# Patient Record
Sex: Female | Born: 1980 | Race: Black or African American | Hispanic: No | Marital: Married | Smoking: Former smoker
Health system: Southern US, Community
[De-identification: ages and names within clinical notes are randomized; demographics above are authoritative.]

## PROBLEM LIST (undated history)

## (undated) DIAGNOSIS — J45909 Unspecified asthma, uncomplicated: Secondary | ICD-10-CM

## (undated) DIAGNOSIS — I10 Essential (primary) hypertension: Secondary | ICD-10-CM

## (undated) DIAGNOSIS — N83209 Unspecified ovarian cyst, unspecified side: Secondary | ICD-10-CM

## (undated) DIAGNOSIS — O24419 Gestational diabetes mellitus in pregnancy, unspecified control: Secondary | ICD-10-CM

## (undated) DIAGNOSIS — D649 Anemia, unspecified: Secondary | ICD-10-CM

## (undated) HISTORY — DX: Anemia, unspecified: D64.9

## (undated) HISTORY — DX: Gestational diabetes mellitus in pregnancy, unspecified control: O24.419

---

## 2006-01-02 ENCOUNTER — Emergency Department: Payer: Self-pay

## 2006-07-16 ENCOUNTER — Emergency Department: Payer: Self-pay | Admitting: Emergency Medicine

## 2006-07-16 ENCOUNTER — Other Ambulatory Visit: Payer: Self-pay

## 2012-01-11 ENCOUNTER — Emergency Department: Payer: Self-pay | Admitting: Emergency Medicine

## 2012-01-11 LAB — BASIC METABOLIC PANEL
Anion Gap: 7 (ref 7–16)
Co2: 29 mmol/L (ref 21–32)
Creatinine: 0.7 mg/dL (ref 0.60–1.30)
EGFR (African American): >60
EGFR (Non-African Amer.): >60
Osmolality: 281 (ref 275–301)
Potassium: 4 mmol/L (ref 3.5–5.1)

## 2012-01-11 LAB — CBC
HGB: 14 g/dL (ref 12.0–16.0)
MCV: 90 fL (ref 80–100)
RBC: 4.55 10*6/uL (ref 3.80–5.20)
WBC: 10.2 10*3/uL (ref 3.6–11.0)

## 2012-01-11 LAB — CK TOTAL AND CKMB (NOT AT ARMC): CK-MB: 0.6 ng/mL (ref 0.5–3.6)

## 2012-01-12 LAB — CK TOTAL AND CKMB (NOT AT ARMC)
CK, Total: 83 U/L (ref 21–215)
CK-MB: 0.8 ng/mL (ref 0.5–3.6)

## 2012-01-12 LAB — TROPONIN I: Troponin-I: <0.02 ng/mL

## 2015-01-09 ENCOUNTER — Encounter: Payer: Self-pay | Admitting: Emergency Medicine

## 2015-01-09 ENCOUNTER — Emergency Department: Payer: BLUE CROSS/BLUE SHIELD

## 2015-01-09 ENCOUNTER — Emergency Department
Admission: EM | Admit: 2015-01-09 | Discharge: 2015-01-09 | Disposition: A | Discharge status: Home / Self Care | Payer: BLUE CROSS/BLUE SHIELD | Attending: Emergency Medicine | Admitting: Emergency Medicine

## 2015-01-09 DIAGNOSIS — R1030 Lower abdominal pain, unspecified: Secondary | ICD-10-CM | POA: Insufficient documentation

## 2015-01-09 DIAGNOSIS — Z3202 Encounter for pregnancy test, result negative: Secondary | ICD-10-CM | POA: Insufficient documentation

## 2015-01-09 DIAGNOSIS — Z720 Tobacco use: Secondary | ICD-10-CM | POA: Diagnosis not present

## 2015-01-09 DIAGNOSIS — Z8742 Personal history of other diseases of the female genital tract: Secondary | ICD-10-CM

## 2015-01-09 DIAGNOSIS — R1031 Right lower quadrant pain: Secondary | ICD-10-CM

## 2015-01-09 DIAGNOSIS — N83519 Torsion of ovary and ovarian pedicle, unspecified side: Secondary | ICD-10-CM

## 2015-01-09 HISTORY — DX: Unspecified ovarian cyst, unspecified side: N83.209

## 2015-01-09 LAB — URINALYSIS COMPLETE WITH MICROSCOPIC (ARMC ONLY)
Bilirubin Urine: NEGATIVE
GLUCOSE, UA: NEGATIVE mg/dL
Ketones, ur: NEGATIVE mg/dL
Leukocytes, UA: NEGATIVE
NITRITE: NEGATIVE
Protein, ur: NEGATIVE mg/dL
SPECIFIC GRAVITY, URINE: 1.019 (ref 1.005–1.030)
pH: 6 (ref 5.0–8.0)

## 2015-01-09 LAB — CBC WITH DIFFERENTIAL/PLATELET
Basophils Absolute: 0 10*3/uL (ref 0–0.1)
Basophils Relative: 0 %
EOS ABS: 0 10*3/uL (ref 0–0.7)
EOS PCT: 0 %
HCT: 41.2 % (ref 35.0–47.0)
Hemoglobin: 13.7 g/dL (ref 12.0–16.0)
LYMPHS ABS: 2.6 10*3/uL (ref 1.0–3.6)
Lymphocytes Relative: 24 %
MCH: 29.3 pg (ref 26.0–34.0)
MCHC: 33.3 g/dL (ref 32.0–36.0)
MCV: 88.1 fL (ref 80.0–100.0)
MONOS PCT: 5 %
Monocytes Absolute: 0.6 10*3/uL (ref 0.2–0.9)
Neutro Abs: 7.6 10*3/uL — ABNORMAL HIGH (ref 1.4–6.5)
Neutrophils Relative %: 71 %
PLATELETS: 233 10*3/uL (ref 150–440)
RBC: 4.68 MIL/uL (ref 3.80–5.20)
RDW: 13.7 % (ref 11.5–14.5)
WBC: 10.8 10*3/uL (ref 3.6–11.0)

## 2015-01-09 LAB — COMPREHENSIVE METABOLIC PANEL
ALT: 14 U/L (ref 14–54)
ANION GAP: 4 — AB (ref 5–15)
AST: 20 U/L (ref 15–41)
Albumin: 4.2 g/dL (ref 3.5–5.0)
Alkaline Phosphatase: 56 U/L (ref 38–126)
BUN: 8 mg/dL (ref 6–20)
CALCIUM: 9 mg/dL (ref 8.9–10.3)
CHLORIDE: 106 mmol/L (ref 101–111)
CO2: 27 mmol/L (ref 22–32)
Creatinine, Ser: 0.86 mg/dL (ref 0.44–1.00)
GFR calc non Af Amer: >60 mL/min (ref >60)
Glucose, Bld: 103 mg/dL — ABNORMAL HIGH (ref 65–99)
Potassium: 3.9 mmol/L (ref 3.5–5.1)
SODIUM: 137 mmol/L (ref 135–145)
Total Bilirubin: 0.4 mg/dL (ref 0.3–1.2)
Total Protein: 7.3 g/dL (ref 6.5–8.1)

## 2015-01-09 LAB — POCT PREGNANCY, URINE: PREG TEST UR: NEGATIVE

## 2015-01-09 MED ORDER — IBUPROFEN 600 MG PO TABS
600.0000 mg | ORAL_TABLET | Freq: Once | ORAL | Status: AC
  Administered 2015-01-09: 600 mg via ORAL
  Filled 2015-01-09: qty 1

## 2015-01-09 MED ORDER — TRAMADOL HCL 50 MG PO TABS: 50.0000 mg | ORAL_TABLET | Freq: Four times a day (QID) | ORAL | Status: DC | PRN

## 2015-01-09 NOTE — ED Notes (Signed)
Pt refuses IV for IV pain meds. MD notified and po meds given.

## 2015-01-09 NOTE — ED Notes (Signed)
Patient transported to Ultrasound 

## 2015-01-09 NOTE — ED Notes (Signed)
Pt to ED with c/o lower abd. Cramping and vaginal pain, states today after having a BM she started having pain with urination and difficulty urinating, hx of ovarian cysts and states this feels the same

## 2015-01-09 NOTE — ED Provider Notes (Signed)
Time Seen: Approximately 446  I have reviewed the triage notes  Chief Complaint: Abdominal Pain   History of Present Illness: Elizabeth Bryan is a 34 y.o. female who presents with lower middle quadrant and right ovary left lower abdominal pain. She states she noticed the pain approximately 2:30 this afternoon and states she's had some similar pain before in the past which turned out to be an ovarian cyst. She states she took some Goody powders at home without relief of her discomfort. She denies any fever at home, nausea, vomiting. She denies any vaginal discharge or bleeding. She denies any dysuria or hematuria. She states she's been told in the past that she does have blood in her urine and is been evaluated with no obvious known etiology. She denies any dysuria. She states it didn't hurt to urinate and also her to have a bowel movement. She denies any constipation or diarrhea. She denies any melena or hematochezia.   Past Medical History  Diagnosis Date  . Ovarian cyst     There are no active problems to display for this patient.   Past Surgical History  Procedure Laterality Date  . Cesarean section      Past Surgical History  Procedure Laterality Date  . Cesarean section      Current Outpatient Rx  Name  Route  Sig  Dispense  Refill  . traMADol (ULTRAM) 50 MG tablet   Oral   Take 1 tablet (50 mg total) by mouth every 6 (six) hours as needed.   8 tablet   0     Allergies:  Chocolate  Family History: No family history on file.  Social History: Social History  Substance Use Topics  . Smoking status: Current Every Day Smoker  . Smokeless tobacco: None  . Alcohol Use: Yes     Review of Systems:   10 point review of systems was performed and was otherwise negative:  Constitutional: No fever Eyes: No visual disturbances ENT: No sore throat, ear pain Cardiac: No chest pain Respiratory: No shortness of breath, wheezing, or stridor Abdomen: Lower middle  quadrant abdominal pain, crampy in nature. No vomiting or diarrhea Endocrine: No weight loss, No night sweats Extremities: No peripheral edema, cyanosis Skin: No rashes, easy bruising Neurologic: No focal weakness, trouble with speech or swollowing Urologic: No dysuria, Hematuria, or urinary frequency   Physical Exam:  ED Triage Vitals  Enc Vitals Group     BP 01/09/15 1627 131/90 mmHg     Pulse Rate 01/09/15 1627 81     Resp 01/09/15 1627 18     Temp 01/09/15 1627 98.4 F (36.9 C)     Temp Source 01/09/15 1627 Oral     SpO2 01/09/15 1627 99 %     Weight 01/09/15 1627 160 lb (72.576 kg)     Height 01/09/15 1627 5\' 3"  (1.6 m)     Head Cir --      Peak Flow --      Pain Score 01/09/15 1627 10     Pain Loc --      Pain Edu? --      Excl. in [place]? --     General: Awake , Alert , and Oriented times 3; GCS 15 Head: Normal cephalic , atraumatic Eyes: Pupils equal , round, reactive to light Nose/Throat: No nasal drainage, patent upper airway without erythema or exudate.  Neck: Supple, Full range of motion, No anterior adenopathy or palpable thyroid masses Lungs: Clear to ascultation  without wheezes , rhonchi, or rales Heart: Regular rate, regular rhythm without murmurs , gallops , or rubs Abdomen: Tenderness in the right suprapubic region without any palpable masses no focal tenderness over McBurney's point negative Murphy sign no rebound, guarding , or rigidity; bowel sounds positive and symmetric in all 4 quadrants. No organomegaly .        Extremities: 2 plus symmetric pulses. No edema, clubbing or cyanosis Neurologic: normal ambulation, Motor symmetric without deficits, sensory intact Skin: warm, dry, no rashes   Labs:   All laboratory work was reviewed including any pertinent negatives or positives listed below:  Labs Reviewed  CBC WITH DIFFERENTIAL/PLATELET - Abnormal; Notable for the following:    Neutro Abs 7.6 (*)    All other components within normal limits   COMPREHENSIVE METABOLIC PANEL - Abnormal; Notable for the following:    Glucose, Bld 103 (*)    Anion gap 4 (*)    All other components within normal limits  URINALYSIS COMPLETEWITH MICROSCOPIC ([facility] ONLY) - Abnormal; Notable for the following:    Color, Urine YELLOW (*)    APPearance CLEAR (*)    Hgb urine dipstick 3+ (*)    Bacteria, UA RARE (*)    Squamous Epithelial / LPF 0-5 (*)    All other components within normal limits  POCT PREGNANCY, URINE   lab work was significant for hematuria (microscopic). Patient states she's had a long history of blood in her urine and this is been evaluated on an outpatient basis. Otherwise was no significant findings on her laboratory work.   Radiology:   TRANSABDOMINAL AND TRANSVAGINAL ULTRASOUND OF PELVIS  DOPPLER ULTRASOUND OF OVARIES  TECHNIQUE: Both transabdominal and transvaginal ultrasound examinations of the pelvis were performed. Transabdominal technique was performed for global imaging of the pelvis including uterus, ovaries, adnexal regions, and pelvic cul-de-sac.  It was necessary to proceed with endovaginal exam following the transabdominal exam to visualize the ovaries and endometrium. Color and duplex Doppler ultrasound was utilized to evaluate blood flow to the ovaries.  COMPARISON: None.  FINDINGS: Uterus  Measurements: 9.4 x 4.4 x 4.7 cm and anteverted. No fibroids or other mass visualized.  Endometrium  Thickness: 7 mm. No focal abnormality visualized.  Right ovary  Measurements: 3.9 x 2.9 x 3.2 cm. Normal appearance/no adnexal mass.  Left ovary  Measurements: 3.2 x 1.7 x 2.3 cm. Normal appearance/no adnexal mass.  Pulsed Doppler evaluation of both ovaries demonstrates normal low-resistance arterial and venous waveforms.  Other findings  Small amount of free pelvic fluid may be physiologic.  IMPRESSION: Small amount of free pelvic fluid which may be physiologic.  Otherwise unremarkable pelvic  ultrasound. Normal ovaries. No evidence of ovarian torsion.     I personally reviewed the radiologic studies     ED Course:  Differential diagnosis includes but is not exclusive to ovarian cyst, ovarian torsion, acute appendicitis, urinary tract infection, endometriosis, bowel obstruction, colitis, renal colic, gastroenteritis, etc. Given the patient's current clinical presentation and objective findings I felt this was unlikely to be a ovarian torsion, acute appendicitis, or other surgical issue. Patient felt symptomatically improved with by mouth ibuprofen here in emergency department repeat exam was benign. She's afebrile and a normal white blood cell count. Patient was advised she may have had an ovarian cyst rupture and that may explain the fluid in the pelvic ultrasound.   Assessment:  Acute lower abdominal pain in female History of ovarian cysts  Final Clinical Impression:   Final diagnoses:  Lower  abdominal pain     Plan:  Outpatient management Patient was advised to return immediately if condition worsens. Patient was advised to follow up with her primary care physician or other specialized physicians involved and in their current assessment.            Daymon Larsen, MD 01/09/15 2015

## 2015-01-09 NOTE — Discharge Instructions (Signed)

## 2015-05-14 ENCOUNTER — Encounter: Payer: Self-pay | Admitting: Emergency Medicine

## 2015-05-14 ENCOUNTER — Emergency Department: Payer: BLUE CROSS/BLUE SHIELD

## 2015-05-14 ENCOUNTER — Emergency Department
Admission: EM | Admit: 2015-05-14 | Discharge: 2015-05-14 | Disposition: A | Discharge status: Home / Self Care | Payer: BLUE CROSS/BLUE SHIELD | Attending: Obstetrics & Gynecology | Admitting: Obstetrics & Gynecology

## 2015-05-14 ENCOUNTER — Emergency Department: Payer: BLUE CROSS/BLUE SHIELD | Admitting: Anesthesiology

## 2015-05-14 ENCOUNTER — Encounter
Admission: EM | Disposition: A | Discharge status: Home / Self Care | Payer: Self-pay | Source: Home / Self Care | Attending: Emergency Medicine

## 2015-05-14 DIAGNOSIS — N83209 Unspecified ovarian cyst, unspecified side: Secondary | ICD-10-CM | POA: Diagnosis not present

## 2015-05-14 DIAGNOSIS — R42 Dizziness and giddiness: Secondary | ICD-10-CM | POA: Diagnosis not present

## 2015-05-14 DIAGNOSIS — O039 Complete or unspecified spontaneous abortion without complication: Secondary | ICD-10-CM

## 2015-05-14 DIAGNOSIS — Z91018 Allergy to other foods: Secondary | ICD-10-CM | POA: Insufficient documentation

## 2015-05-14 DIAGNOSIS — D649 Anemia, unspecified: Secondary | ICD-10-CM | POA: Insufficient documentation

## 2015-05-14 DIAGNOSIS — O034 Incomplete spontaneous abortion without complication: Secondary | ICD-10-CM | POA: Diagnosis present

## 2015-05-14 DIAGNOSIS — F17200 Nicotine dependence, unspecified, uncomplicated: Secondary | ICD-10-CM | POA: Insufficient documentation

## 2015-05-14 DIAGNOSIS — N939 Abnormal uterine and vaginal bleeding, unspecified: Secondary | ICD-10-CM

## 2015-05-14 HISTORY — PX: DILATION AND EVACUATION: SHX1459

## 2015-05-14 LAB — COMPREHENSIVE METABOLIC PANEL
ALK PHOS: 41 U/L (ref 38–126)
ALT: 18 U/L (ref 14–54)
AST: 19 U/L (ref 15–41)
Albumin: 3.2 g/dL — ABNORMAL LOW (ref 3.5–5.0)
Anion gap: 6 (ref 5–15)
BILIRUBIN TOTAL: 0.2 mg/dL — AB (ref 0.3–1.2)
BUN: 8 mg/dL (ref 6–20)
CALCIUM: 8.6 mg/dL — AB (ref 8.9–10.3)
CHLORIDE: 106 mmol/L (ref 101–111)
CO2: 27 mmol/L (ref 22–32)
CREATININE: 0.69 mg/dL (ref 0.44–1.00)
Glucose, Bld: 99 mg/dL (ref 65–99)
Potassium: 3.6 mmol/L (ref 3.5–5.1)
Sodium: 139 mmol/L (ref 135–145)
TOTAL PROTEIN: 5.9 g/dL — AB (ref 6.5–8.1)

## 2015-05-14 LAB — CBC WITH DIFFERENTIAL/PLATELET
Basophils Absolute: 0 10*3/uL (ref 0–0.1)
Basophils Relative: 0 %
EOS PCT: 1 %
Eosinophils Absolute: 0.1 10*3/uL (ref 0–0.7)
HEMATOCRIT: 17.9 % — AB (ref 35.0–47.0)
Hemoglobin: 6.2 g/dL — ABNORMAL LOW (ref 12.0–16.0)
LYMPHS ABS: 2.4 10*3/uL (ref 1.0–3.6)
LYMPHS PCT: 22 %
MCH: 30.2 pg (ref 26.0–34.0)
MCHC: 34.7 g/dL (ref 32.0–36.0)
MCV: 87.1 fL (ref 80.0–100.0)
Monocytes Absolute: 0.6 10*3/uL (ref 0.2–0.9)
Monocytes Relative: 6 %
NEUTROS ABS: 7.9 10*3/uL — AB (ref 1.4–6.5)
Neutrophils Relative %: 71 %
PLATELETS: 221 10*3/uL (ref 150–440)
RBC: 2.05 MIL/uL — AB (ref 3.80–5.20)
RDW: 13.9 % (ref 11.5–14.5)
WBC: 11.1 10*3/uL — AB (ref 3.6–11.0)

## 2015-05-14 LAB — HCG, QUANTITATIVE, PREGNANCY: hCG, Beta Chain, Quant, S: 10650 m[IU]/mL — ABNORMAL HIGH (ref <5)

## 2015-05-14 LAB — ABO/RH: ABO/RH(D): O POS

## 2015-05-14 SURGERY — DILATION AND EVACUATION, UTERUS
Anesthesia: General

## 2015-05-14 MED ORDER — METHYLERGONOVINE MALEATE 0.2 MG PO TABS
0.2000 mg | ORAL_TABLET | Freq: Four times a day (QID) | ORAL | Status: DC
  Administered 2015-05-14: 0.2 mg via ORAL
  Filled 2015-05-14 (×3): qty 1

## 2015-05-14 MED ORDER — ONDANSETRON HCL 4 MG/2ML IJ SOLN
INTRAMUSCULAR | Status: DC | PRN
  Administered 2015-05-14: 4 mg via INTRAVENOUS

## 2015-05-14 MED ORDER — HYDROCODONE-ACETAMINOPHEN 5-325 MG PO TABS
1.0000 | ORAL_TABLET | Freq: Once | ORAL | Status: AC
  Administered 2015-05-14: 1 via ORAL

## 2015-05-14 MED ORDER — METHYLERGONOVINE MALEATE 0.2 MG PO TABS: 0.2000 mg | ORAL_TABLET | Freq: Four times a day (QID) | ORAL | Status: DC

## 2015-05-14 MED ORDER — IBUPROFEN 400 MG PO TABS: 400.0000 mg | ORAL_TABLET | Freq: Four times a day (QID) | ORAL | Status: DC | PRN

## 2015-05-14 MED ORDER — FENTANYL CITRATE (PF) 100 MCG/2ML IJ SOLN
INTRAMUSCULAR | Status: AC
  Filled 2015-05-14: qty 2

## 2015-05-14 MED ORDER — FENTANYL CITRATE (PF) 100 MCG/2ML IJ SOLN
INTRAMUSCULAR | Status: DC | PRN
  Administered 2015-05-14: 100 ug via INTRAVENOUS

## 2015-05-14 MED ORDER — SODIUM CHLORIDE 0.9 % IV SOLN
INTRAVENOUS | Status: DC | PRN
  Administered 2015-05-14: 19:00:00 via INTRAVENOUS

## 2015-05-14 MED ORDER — SODIUM CHLORIDE 0.9 % IV SOLN
10.0000 mL/h | Freq: Once | INTRAVENOUS | Status: AC
  Administered 2015-05-14: 10 mL/h via INTRAVENOUS

## 2015-05-14 MED ORDER — FENTANYL CITRATE (PF) 100 MCG/2ML IJ SOLN
25.0000 ug | INTRAMUSCULAR | Status: DC | PRN
  Administered 2015-05-14 (×4): 25 ug via INTRAVENOUS

## 2015-05-14 MED ORDER — ONDANSETRON HCL 4 MG/2ML IJ SOLN: 4.0000 mg | Freq: Once | INTRAMUSCULAR | Status: DC | PRN

## 2015-05-14 MED ORDER — SUCCINYLCHOLINE CHLORIDE 20 MG/ML IJ SOLN
INTRAMUSCULAR | Status: DC | PRN
  Administered 2015-05-14: 100 mg via INTRAVENOUS

## 2015-05-14 MED ORDER — DOXYCYCLINE HYCLATE 100 MG PO CAPS: 100.0000 mg | ORAL_CAPSULE | Freq: Two times a day (BID) | ORAL | Status: DC

## 2015-05-14 MED ORDER — MIDAZOLAM HCL 2 MG/2ML IJ SOLN
INTRAMUSCULAR | Status: DC | PRN
  Administered 2015-05-14: 2 mg via INTRAVENOUS

## 2015-05-14 MED ORDER — PROPOFOL 10 MG/ML IV BOLUS
INTRAVENOUS | Status: DC | PRN
  Administered 2015-05-14: 150 mg via INTRAVENOUS

## 2015-05-14 SURGICAL SUPPLY — 22 items
BAG COUNTER SPONGE EZ (MISCELLANEOUS) IMPLANT
CANISTER SUC SOCK COL 7IN (MISCELLANEOUS) ×3 IMPLANT
CATH ROBINSON RED A/P 16FR (CATHETERS) ×3 IMPLANT
COUNTER SPONGE BAG EZ (MISCELLANEOUS)
FILTER UTR ASPR SPEC (MISCELLANEOUS) ×1 IMPLANT
FLTR UTR ASPR SPEC (MISCELLANEOUS) ×3
GLOVE BIO SURGEON STRL SZ8 (GLOVE) ×12 IMPLANT
GOWN STRL REUS W/ TWL LRG LVL3 (GOWN DISPOSABLE) ×2 IMPLANT
GOWN STRL REUS W/ TWL XL LVL3 (GOWN DISPOSABLE) ×1 IMPLANT
GOWN STRL REUS W/TWL LRG LVL3 (GOWN DISPOSABLE) ×4
GOWN STRL REUS W/TWL XL LVL3 (GOWN DISPOSABLE) ×2
KIT BERKELEY 1ST TRIMESTER 3/8 (MISCELLANEOUS) ×3 IMPLANT
KIT RM TURNOVER CYSTO AR (KITS) ×3 IMPLANT
NS IRRIG 500ML POUR BTL (IV SOLUTION) ×3 IMPLANT
PACK DNC HYST (MISCELLANEOUS) ×3 IMPLANT
PAD OB MATERNITY 4.3X12.25 (PERSONAL CARE ITEMS) ×3 IMPLANT
PAD PREP 24X41 OB/GYN DISP (PERSONAL CARE ITEMS) ×3 IMPLANT
SET BERKELEY SUCTION TUBING (SUCTIONS) ×3 IMPLANT
TOWEL OR 17X26 4PK STRL BLUE (TOWEL DISPOSABLE) ×3 IMPLANT
VACURETTE 10 RIGID CVD (CANNULA) IMPLANT
VACURETTE 12 RIGID CVD (CANNULA) IMPLANT
VACURETTE 8 RIGID CVD (CANNULA) ×3 IMPLANT

## 2015-05-14 NOTE — ED Notes (Signed)
[place] called, informed of pts hCG quant.  [place] reported that they would come get pt soon.  Pt informed.  Pt experiencing some vaginal bleeding.

## 2015-05-14 NOTE — Transfer of Care (Signed)
Immediate Anesthesia Transfer of Care Note  Patient: Elizabeth Bryan  Procedure(s) Performed: Procedure(s): DILATATION AND EVACUATION (N/A)  Patient Location: PACU  Anesthesia Type:General  Level of Consciousness: awake, alert , oriented and patient cooperative  Airway & Oxygen Therapy: Patient Spontanous Breathing and Patient connected to face mask oxygen  Post-op Assessment: Report given to RN and Post -op Vital signs reviewed and stable  Post vital signs: Reviewed and stable  Last Vitals:  Filed Vitals:   05/14/15 1730 05/14/15 1800  BP: 115/83 100/58  Pulse: 89   Temp:    Resp: 17 23    Complications: No apparent anesthesia complications

## 2015-05-14 NOTE — ED Notes (Signed)
MD at bedside. 

## 2015-05-14 NOTE — H&P (Signed)
Obstetrics & Gynecology Consultation Note  Date of Consultation: 05/14/2015   Requesting Provider: [facility] ER  Primary OBGYN: [facility] Primary Care Provider: Salome Holmes A  Reason for Consultation: Pt is a 35 yo G19P1011 AA F s/p Misoprostol for first trimester fetal demise now with profuse vaginal bleeding.  History of Present Illness: Elizabeth Bryan is a 35 y.o. G2P1011 (Patient's last menstrual period was 05/14/2015.), with the above CC. Bleeding began with onset of pill (one dose) last Wednesday, and escalated yesterday.  Pt was 12 weeks by dates but 5 weeks by 2 ultrasounds over time w no development of fetal cardiac activity.  Crampy, weakness, dizziness, headaceh today.  Has noticed clot with bleeding.  ROS: A 12-point review of systems was performed and negative, except as stated in the above HPI.  OBGYN History: As per HPI. OB History    No data available      Past Medical History: Past Medical History  Diagnosis Date  . Ovarian cyst     Past Surgical History: Past Surgical History  Procedure Laterality Date  . Cesarean section      Family History:  History reviewed. No pertinent family history. She denies any female cancers, bleeding or blood clotting disorders.   Social History:  Social History   Social History  . Marital Status: Married    Spouse Name: N/A  . Number of Children: N/A  . Years of Education: N/A   Occupational History  . Not on file.   Social History Main Topics  . Smoking status: Current Every Day Smoker  . Smokeless tobacco: Not on file  . Alcohol Use: Yes  . Drug Use: Not on file  . Sexual Activity: Not on file   Other Topics Concern  . Not on file   Social History Narrative   Allergy: Allergies  Allergen Reactions  . Chocolate Hives    Current Outpatient Medications:  (Not in a hospital admission)   Hospital Medications: Current Facility-Administered Medications  Medication Dose Route Frequency Provider Last Rate Last  Dose  . 0.9 %  sodium chloride infusion  10 mL/hr Intravenous Once Lavonia Drafts, MD      . methylergonovine (METHERGINE) tablet 0.2 mg  0.2 mg Oral QID Gae Dry, MD       Current Outpatient Prescriptions  Medication Sig Dispense Refill  . ibuprofen (ADVIL,MOTRIN) 600 MG tablet Take 600 mg by mouth every 6 (six) hours as needed. for pain  0  . misoprostol (CYTOTEC) 200 MCG tablet Take 4 tablets by mouth every 6 (six) hours as needed.  0  . oxyCODONE-acetaminophen (PERCOCET/ROXICET) 5-325 MG tablet Take 1 tablet by mouth every 4 (four) hours as needed. for pain  0     Physical Exam: Filed Vitals:   05/14/15 1052 05/14/15 1100 05/14/15 1251 05/14/15 1253  BP:   96/71   Pulse: 90 99  85  Temp:      TempSrc:      Resp: 16 19 16 19   Height:      Weight:      SpO2: 100% 100%  100%    Temp:  [98.2 F (36.8 C)] 98.2 F (36.8 C) (02/13 0953) Pulse Rate:  [85-99] 85 (02/13 1253) Resp:  [16-19] 19 (02/13 1253) BP: (96-130)/(70-71) 96/71 mmHg (02/13 1251) SpO2:  [100 %] 100 % (02/13 1253) Weight:  [174 lb (78.926 kg)] 174 lb (78.926 kg) (02/13 0953)     No intake or output data in the 24 hours ending 05/14/15 1331  Current Vital Signs 24h Vital Sign Ranges  T 98.2 F (36.8 C) Temp  Avg: 98.2 F (36.8 C)  Min: 98.2 F (36.8 C)  Max: 98.2 F (36.8 C)  BP 96/71 mmHg BP  Min: 96/71  Max: 130/70  HR 85 Pulse  Avg: 92.3  Min: 85  Max: 99  RR 19 Resp  Avg: 17.6  Min: 16  Max: 19  SaO2 100 % Not Delivered SpO2  Avg: 100 %  Min: 100 %  Max: 100 %       24 Hour I/O Current Shift I/O  Time Ins Outs       Patient Vitals for the past 8 hrs:  BP Temp Temp src Pulse Resp SpO2 Height Weight  05/14/15 1253 - - - 85 19 100 % - -  05/14/15 1251 96/71 mmHg - - - 16 - - -  05/14/15 1100 - - - 99 19 100 % - -  05/14/15 1052 - - - 90 16 100 % - -  05/14/15 0953 130/70 mmHg 98.2 F (36.8 C) Oral 95 18 100 % 5\' 3"  (1.6 m) 174 lb (78.926 kg)    Body mass index is 30.83  kg/(m^2). General appearance: Well nourished, well developed female in no acute distress.  Neck:  Supple, normal appearance, and no thyromegaly  Cardiovascular:Regular rate and rhythm.  No murmurs, rubs or gallops. Respiratory:  Clear to auscultation bilateral. Normal respiratory effort Abdomen: positive bowel sounds and no masses, hernias; diffusely non tender to palpation, non distended Neuro/Psych:  Normal mood and affect.  Skin:  Warm and dry.  Lymphatic:  No inguinal lymphadenopathy.   Pelvic exam: is not limited by body habitus EGBUS: within normal limits, Vagina: within normal limits and with clot / blood in the vault, Cervix:  Dilated as clot is sitting there, some bleeding after removal of clot/tissue.  Uterus:  Small, retroverted and approximately 6 week sized, and Adnexa:  No mass.   Laboratory: Beta HCG: pending O+   Recent Labs Lab 05/14/15 0958  WBC 11.1*  HGB 6.2*  HCT 17.9*  PLT 221    Recent Labs Lab 05/14/15 0958  NA 139  K 3.6  CL 106  CO2 27  BUN 8  CREATININE 0.69  CALCIUM 8.6*  PROT 5.9*  BILITOT 0.2*  ALKPHOS 41  ALT 18  AST 19  GLUCOSE 99   No results for input(s): APTT, INR, PTT in the last 168 hours.  Invalid input(s): DRHAPTT  Recent Labs Lab 05/14/15 1027  [place] O POS    Imaging:  pending  Assessment: [name] is a 35 y.o. G2P1011 with profuse vaginal bleeding and anemia after medical treatment of fetal demise.   Plan: Blood transfusion due to sharp drop in Hct and symptoms of anemia (headache, weakness, dizziness). Methergine to help uterus contract down.  Monitor bleeding after first dose. Consider D&C if continues w bleeding or based on [place] results.  Barnett Applebaum, MD [facility] OBGYN Pager [phone]

## 2015-05-14 NOTE — Op Note (Signed)
  Operative Note  05/14/2015 7:43 PM  PRE-OP DIAGNOSIS: incomplete abortion   POST-OP DIAGNOSIS: same  SURGEON: Barnett Applebaum, MD, FACOG  ANESTHESIA: Choice   PROCEDURE: Procedure(s): DILATATION AND EVACUATION   ESTIMATED BLOOD LOSS: Minimal   SPECIMENS: POC  COMPLICATIONS: none  DISPOSITION: PACU - hemodynamically stable.  CONDITION: stable  FINDINGS: Exam under anesthesia revealed a 4-6 wk size uterus without palpable adnexal masses.   INDICATION FOR PROCEDURE: Incomplete abortion with continued pain and bleeding, and retained POC seen on [place].  PROCEDURE IN DETAIL: After informed consent was obtained, the patient was taken to the operating room where anesthesia was obtained without difficulty. The patient was positioned in the dorsal lithotomy position with [employer]. Time out was performed and an exam under anesthesia was performed. The vagina, perineum, and lower abdomen were prepped and draped in a normal sterile fashion. The bladder was emptied with an I&O catheter. A speculum was placed into the vagina and the cervix was grasped with a single toothed tenaculum. The uterus was sounded to 8 cm.  The cervix was gently dilated to 20 [place] with  [employer] dilators. The suction was then tested and found to be adequate, and a 34mm rigid suction cannula was advanced into the uterine cavity. The suction was activated and the contents of the uterus were aspirated until no further tissue was obtained. The uterus was then curetted to gritty texture throughout.  At the end of the procedure bleeding was noted to be Mild-Moderate.  All instruments were then removed from the vagina.The patient tolerated the procedure well. All sponge, instrument, and needle counts were correct. The patient was taken to the recovery room in good condition.

## 2015-05-14 NOTE — ED Provider Notes (Signed)
-----------------------------------------   6:15 PM on 05/14/2015 -----------------------------------------  Patient has been seen by Dr. Kenton Kingfisher, he states he will take the patient for a D&C. Patient will be admitted.  Harvest Dark, MD 05/14/15 1816

## 2015-05-14 NOTE — ED Notes (Signed)
Resumed care from nellie rn.

## 2015-05-14 NOTE — ED Notes (Signed)
Blood infusing.  Family with pt.  No reaction noted.  nsr on monitor. Pt alert.  Skin warm and dry.

## 2015-05-14 NOTE — Progress Notes (Signed)
[facility] EMERGENCY DEPARTMENT [place] [id number] [place] [address] Phone: [phone] Fax: [phone]  May 14, 2015  Patient: Elizabeth Bryan  Date of Birth: Mar 11, 1981  Date of Visit: 05/14/2015    To Whom It May Concern:  Elizabeth Bryan was seen and treated in our [facility] on 05/14/2015. Elizabeth Bryan  may return to work on 05/21/2015.  Sincerely,  Barnett Applebaum, MD [facility] Ob/Gyn

## 2015-05-14 NOTE — ED Notes (Addendum)
Pt reports last meal being last night. Pt reports eating 8 fries 30 minutes ago

## 2015-05-14 NOTE — ED Notes (Signed)
Report given to mindy rn in OR.  Iv fluids infusing.  Pt alert.  Skin warm and dry.

## 2015-05-14 NOTE — Anesthesia Preprocedure Evaluation (Signed)
Anesthesia Evaluation  Patient identified by MRN, date of birth, ID band Patient awake    Reviewed: Allergy & Precautions, NPO status , Patient's Chart, lab work & pertinent test results  Airway Mallampati: II  TM Distance: >3 FB     Dental no notable dental hx.    Pulmonary Current Smoker,    Pulmonary exam normal        Cardiovascular negative cardio ROS Normal cardiovascular exam     Neuro/Psych negative neurological ROS  negative psych ROS   GI/Hepatic negative GI ROS, Neg liver ROS,   Endo/Other  negative endocrine ROS  Renal/GU negative Renal ROS  Female GU complaint Ovarian cyst    Musculoskeletal negative musculoskeletal ROS (+)   Abdominal Normal abdominal exam  (+)   Peds negative pediatric ROS (+)  Hematology negative hematology ROS (+)   Anesthesia Other Findings   Reproductive/Obstetrics For a D/C                             Anesthesia Physical Anesthesia Plan  ASA: II and emergent  Anesthesia Plan: General   Post-op Pain Management:    Induction: Intravenous, Rapid sequence and Cricoid pressure planned  Airway Management Planned: Oral ETT  Additional Equipment:   Intra-op Plan:   Post-operative Plan: Extubation in OR  Informed Consent: I have reviewed the patients History and Physical, chart, labs and discussed the procedure including the risks, benefits and alternatives for the proposed anesthesia with the patient or authorized representative who has indicated his/her understanding and acceptance.   Dental advisory given  Plan Discussed with: CRNA and Surgeon  Anesthesia Plan Comments:         Anesthesia Quick Evaluation

## 2015-05-14 NOTE — ED Notes (Signed)
Op permit signed by pt.  Pt alert.  Skin warm and dry.  nsr on monitor.  IV n.saline infusing.

## 2015-05-14 NOTE — ED Notes (Signed)
Lab reports hCG quat is still being run

## 2015-05-14 NOTE — ED Notes (Signed)
Patient transported to Ultrasound 

## 2015-05-14 NOTE — ED Provider Notes (Signed)
[facility] Emergency Department Provider Note  ____________________________________________    I have reviewed the triage vital signs and the nursing notes.   HISTORY  Chief Complaint Vaginal Bleeding    HPI Elizabeth Bryan is a 35 y.o. female who presents with complaints of heavy vaginal bleeding and dizziness. Patient reports she was diagnosed with a miscarriage 4 days ago by [place] and was given misoprostol to take to complete the miscarriage. She reports she took the medication as prescribed initially had heavy bleeding but this seemed to improve over the weekend. However Sunday evening she bled very heavily throughout the night which led to her coming to the emergency department this morning. She complains of weakness and dizziness. This is worse when standing. She does note that her bleeding has improved this morning     Past Medical History  Diagnosis Date  . Ovarian cyst     There are no active problems to display for this patient.   Past Surgical History  Procedure Laterality Date  . Cesarean section      Current Outpatient Rx  Name  Route  Sig  Dispense  Refill  . ibuprofen (ADVIL,MOTRIN) 600 MG tablet   Oral   Take 600 mg by mouth every 6 (six) hours as needed. for pain      0   . misoprostol (CYTOTEC) 200 MCG tablet   Oral   Take 4 tablets by mouth every 6 (six) hours as needed.      0   . oxyCODONE-acetaminophen (PERCOCET/ROXICET) 5-325 MG tablet   Oral   Take 1 tablet by mouth every 4 (four) hours as needed. for pain      0     Allergies Chocolate  History reviewed. No pertinent family history.  Social History Social History  Substance Use Topics  . Smoking status: Current Every Day Smoker  . Smokeless tobacco: None  . Alcohol Use: Yes    Review of Systems  Constitutional: Positive for dizziness and weakness diffusely Eyes: Negative for visual changes. ENT: Negative for nausea Cardiovascular:  Negative for chest pain. Respiratory: Negative for shortness of breath. Gastrointestinal: Negative for abdominal pain Genitourinary: Vaginal bleeding as above Musculoskeletal: Negative for back pain. Skin: Positive for pallor Neurological: Negative for  focal weakness Psychiatric: No anxiety    ____________________________________________   PHYSICAL EXAM:  VITAL SIGNS: ED Triage Vitals  Enc Vitals Group     BP 05/14/15 0953 130/70 mmHg     Pulse Rate 05/14/15 0953 95     Resp 05/14/15 0953 18     Temp 05/14/15 0953 98.2 F (36.8 C)     Temp Source 05/14/15 0953 Oral     SpO2 05/14/15 0953 100 %     Weight 05/14/15 0953 174 lb (78.926 kg)     Height 05/14/15 0953 5\' 3"  (1.6 m)     Head Cir --      Peak Flow --      Pain Score 05/14/15 0953 6     Pain Loc --      Pain Edu? --      Excl. in [place]? --     Constitutional: Alert and oriented. Well appearing and in no distress. Patient is pale appearing Eyes: Pale conjunctivae noted ENT   Head: Normocephalic and atraumatic.   Mouth/Throat: Mucous membranes are moist. Cardiovascular: Normal rate, regular rhythm. Normal and symmetric distal pulses are present in all extremities. No murmurs, rubs, or gallops. Respiratory: Normal respiratory effort without  tachypnea nor retractions. Breath sounds are clear and equal bilaterally.  Gastrointestinal: Soft and non-tender in all quadrants. No distention. There is no CVA tenderness. Genitourinary: deferred for specialist Musculoskeletal: Nontender with normal range of motion in all extremities. No lower extremity tenderness nor edema. Neurologic:  Normal speech and language. No gross focal neurologic deficits are appreciated. Skin:  Skin is warm, dry and intact. No rash noted. Psychiatric: Mood and affect are normal. Patient exhibits appropriate insight and judgment.  ____________________________________________    LABS (pertinent positives/negatives)  Labs Reviewed  CBC  WITH DIFFERENTIAL/PLATELET - Abnormal; Notable for the following:    WBC 11.1 (*)    RBC 2.05 (*)    Hemoglobin 6.2 (*)    HCT 17.9 (*)    Neutro Abs 7.9 (*)    All other components within normal limits  COMPREHENSIVE METABOLIC PANEL - Abnormal; Notable for the following:    Calcium 8.6 (*)    Total Protein 5.9 (*)    Albumin 3.2 (*)    Total Bilirubin 0.2 (*)    All other components within normal limits  HCG, QUANTITATIVE, PREGNANCY  TYPE AND SCREEN  PREPARE RBC (CROSSMATCH)  ABO/RH    ____________________________________________   EKG  None  ____________________________________________    RADIOLOGY I have personally reviewed any xrays that were ordered on this patient: Ultrasound requested by gynecology  ____________________________________________   PROCEDURES  Procedure(s) performed: none  Critical Care performed: yes CRITICAL CARE Performed by: Lavonia Drafts   Total critical care time: 44minutes  Critical care time was exclusive of separately billable procedures and treating other patients.  Critical care was necessary to treat or prevent imminent or life-threatening deterioration.  Critical care was time spent personally by me on the following activities: development of treatment plan with patient and/or surrogate as well as nursing, discussions with consultants, evaluation of patient's response to treatment, examination of patient, obtaining history from patient or surrogate, ordering and performing treatments and interventions, ordering and review of laboratory studies, ordering and review of radiographic studies, pulse oximetry and re-evaluation of patient's condition.   ____________________________________________   INITIAL IMPRESSION / ASSESSMENT AND PLAN / ED COURSE  Pertinent labs & imaging results that were available during my care of the patient were reviewed by me and considered in my medical decision making (see chart for  details).  Patient resents with heavy vaginal bleeding and weakness. Her hemoglobin is 6.2, this is down from 13.7 when it was last checked. I discussed with Dr. Leonides Schanz of gynecology who requests an ultrasound, beta hCG and will admit the patient.   I ordered 2 units of packed red blood cells given that the patient is having ongoing bleeding. I consented the patient.  ____________________________________________   FINAL CLINICAL IMPRESSION(S) / ED DIAGNOSES  Final diagnoses:  Vaginal bleeding  Anemia due to blood loss   Lavonia Drafts, MD 05/14/15 1143

## 2015-05-14 NOTE — ED Notes (Addendum)
OBGYN MD at bedside.

## 2015-05-14 NOTE — ED Notes (Signed)
Reports miscarriage a few days ago, still having bleeding with clots.  Pt skin w/d pale.

## 2015-05-14 NOTE — Discharge Instructions (Signed)
Dilation and Curettage or Vacuum Curettage, Care After Refer to this sheet in the next few weeks. These instructions provide you with information on caring for yourself after your procedure. Your health care provider may also give you more specific instructions. Your treatment has been planned according to current medical practices, but problems sometimes occur. Call your health care provider if you have any problems or questions after your procedure. WHAT TO EXPECT AFTER THE PROCEDURE After your procedure, it is typical to have light cramping and bleeding. This may last for 2 days to 2 weeks after the procedure. HOME CARE INSTRUCTIONS   Do not drive for 24 hours.  Wait 1 week before returning to strenuous activities.  Take your temperature 2 times a day for 4 days and write it down. Provide these temperatures to your health care provider if you develop a fever.  Avoid long periods of standing.  Avoid heavy lifting, pushing, or pulling. Do not lift anything heavier than 10 pounds (4.5 kg).  Limit stair climbing to once or twice a day.  Take rest periods often.  You may resume your usual diet.  Drink enough fluids to keep your urine clear or pale yellow.  Your usual bowel function should return. If you have constipation, you may:  Take a mild laxative with permission from your health care provider.  Add fruit and bran to your diet.  Drink more fluids.  Take showers instead of baths until your health care provider gives you permission to take baths.  Do not go swimming or use a hot tub until your health care provider approves.  Try to have someone with you or available to you the first 24-48 hours, especially if you were given a general anesthetic.  Do not douche, use tampons, or have sex (intercourse) for 2 weeks after the procedure.  Only take over-the-counter or prescription medicines as directed by your health care provider. Do not take aspirin. It can cause  bleeding.  Follow up with your health care provider as directed. SEEK MEDICAL CARE IF:   You have increasing cramps or pain that is not relieved with medicine.  You have abdominal pain that does not seem to be related to the same area of earlier cramping and pain.  You have bad smelling vaginal discharge.  You have a rash.  You are having problems with any medicine. SEEK IMMEDIATE MEDICAL CARE IF:   You have bleeding that is heavier than a normal menstrual period.  You have a fever.  You have chest pain.  You have shortness of breath.  You feel dizzy or feel like fainting.  You pass out.  You have pain in your shoulder strap area.  You have heavy vaginal bleeding with or without blood clots. MAKE SURE YOU:   Understand these instructions.  Will watch your condition.  Will get help right away if you are not doing well or get worse.   This information is not intended to replace advice given to you by your health care provider. Make sure you discuss any questions you have with your health care provider.   Document Released: 03/14/2000 Document Revised: 03/22/2013 Document Reviewed: 10/14/2012 Elsevier Interactive Patient Education 2016 [employer]  

## 2015-05-14 NOTE — ED Notes (Signed)
Dr Kenton Kingfisher in with pt.  transfusion complete.  Iv fluids infusing.  No reaction noted.

## 2015-05-14 NOTE — ED Notes (Signed)
Pt alert. Family with pt.  No reaction noted.  Blood infusing.

## 2015-05-14 NOTE — Anesthesia Procedure Notes (Signed)
Procedure Name: Intubation Date/Time: 05/14/2015 7:22 PM Performed by: Lendon Colonel Pre-anesthesia Checklist: Patient identified, Emergency Drugs available, Suction available, Patient being monitored and Timeout performed Patient Re-evaluated:Patient Re-evaluated prior to inductionOxygen Delivery Method: Circle system utilized Preoxygenation: Pre-oxygenation with 100% oxygen Intubation Type: IV induction, Rapid sequence and Cricoid Pressure applied Laryngoscope Size: 2 Grade View: Grade I Laser Tube: Cuffed inflated with minimal occlusive pressure - saline Tube size: 7.0 mm Number of attempts: 1 Airway Equipment and Method: Stylet Placement Confirmation: ETT inserted through vocal cords under direct vision,  positive ETCO2 and breath sounds checked- equal and bilateral Secured at: 20 cm Tube secured with: Tape Dental Injury: Teeth and Oropharynx as per pre-operative assessment

## 2015-05-14 NOTE — Progress Notes (Signed)
Pt still has crampy abd pains.  Still has some bleeding. [place]- gest sac still seen 2U given for anemia  Plan- Due to pain and US findings, options of suction D&C as next best option discussed and planned for tonight.  Alternative such as exp mgt also discussed. Consent obtained. Plan out of work note. Plan meds for pain and bleeding and ABX.  Will need f/u for anemia check.

## 2015-05-15 ENCOUNTER — Encounter: Payer: Self-pay | Admitting: Obstetrics & Gynecology

## 2015-05-16 LAB — TYPE AND SCREEN
ABO/RH(D): O POS
ANTIBODY SCREEN: NEGATIVE
UNIT DIVISION: 0
Unit division: 0

## 2015-05-16 LAB — SURGICAL PATHOLOGY

## 2015-05-16 LAB — PREPARE RBC (CROSSMATCH)

## 2015-05-16 NOTE — Anesthesia Postprocedure Evaluation (Signed)
Anesthesia Post Note  Patient: Elizabeth Bryan  Procedure(s) Performed: Procedure(s) (LRB): DILATATION AND EVACUATION (N/A)  Patient location during evaluation: PACU Anesthesia Type: General Level of consciousness: awake and alert and oriented Pain management: pain level controlled Vital Signs Assessment: post-procedure vital signs reviewed and stable Respiratory status: spontaneous breathing Cardiovascular status: blood pressure returned to baseline Anesthetic complications: no    Last Vitals:  Filed Vitals:   05/14/15 2110 05/14/15 2115  BP: 121/75 114/67  Pulse: 90 88  Temp:  37 C  Resp: 26 24    Last Pain:  Filed Vitals:   05/14/15 2127  PainSc: 1                  Sybel Standish

## 2016-10-17 ENCOUNTER — Ambulatory Visit: Payer: Self-pay | Admitting: Advanced Practice Midwife

## 2017-07-31 IMAGING — US US ART/VEN ABD/PELV/SCROTUM DOPPLER LTD
1 series · 13 of 25 positions shown · non-contrast
Comparison: None.

CLINICAL DATA: 34-year-old female with acute pelvic pain for 1 day.



[Series 1: us art/ven abd/pelv/scrotum doppler ltd · 0.21mm/px · 13 of 93 slices shown]
[im 1/93]
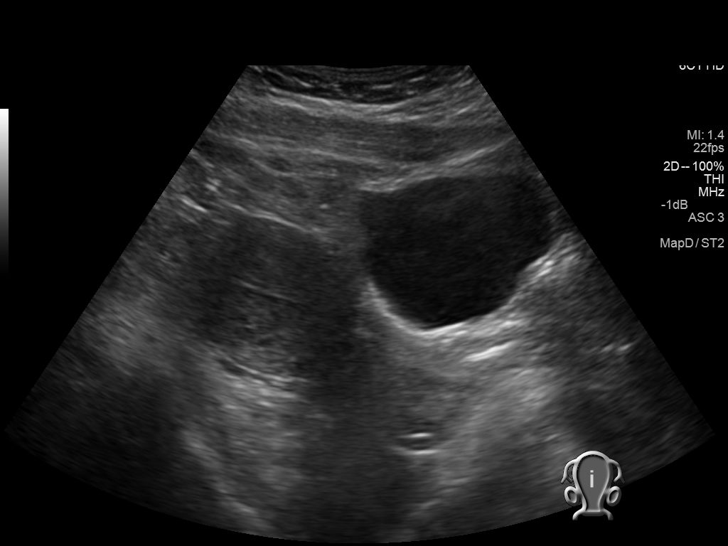
[im 8/93]
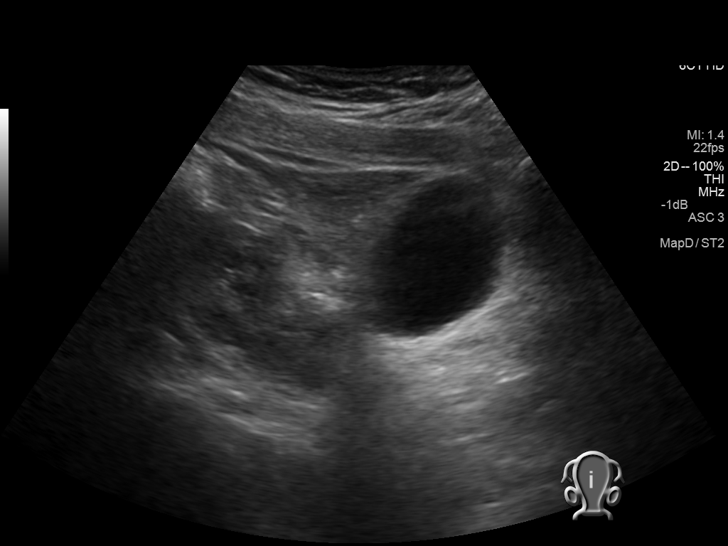
[im 16/93]
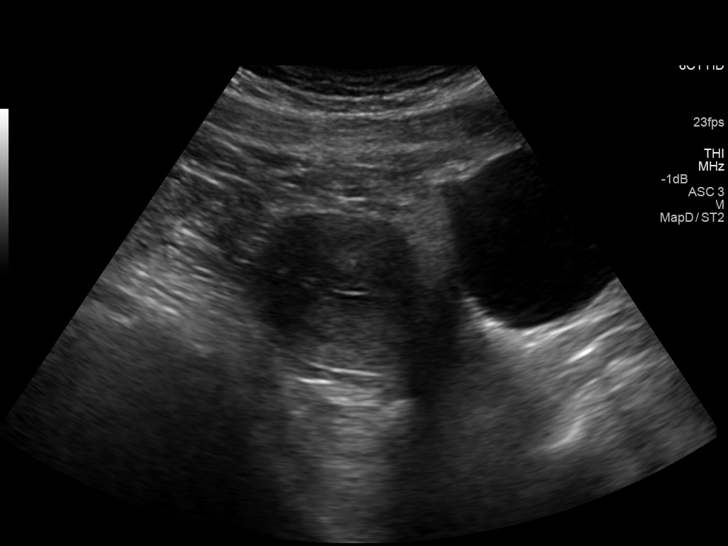
[im 24/93]
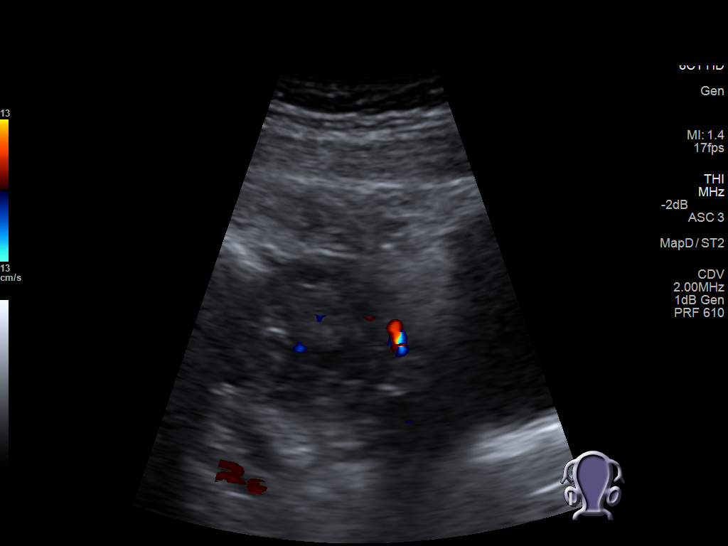
[im 31/93]
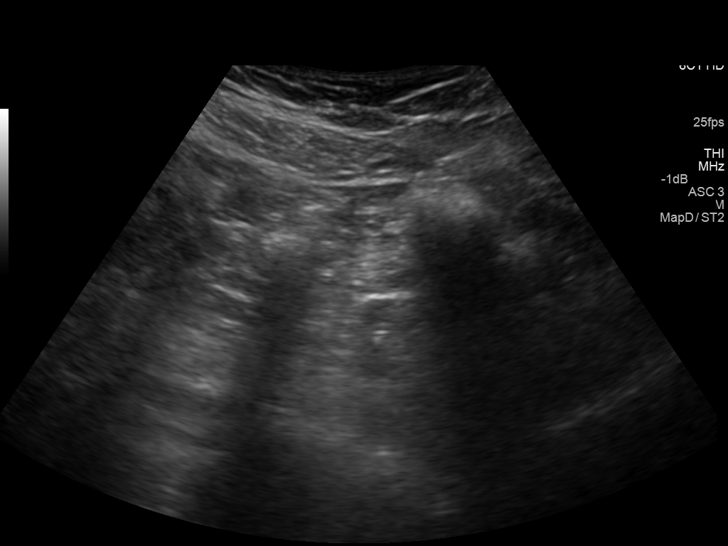
[im 39/93]
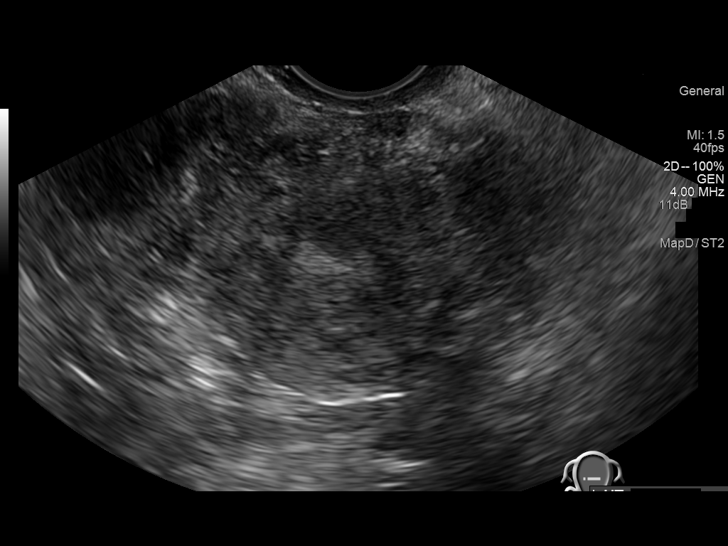
[im 47/93]
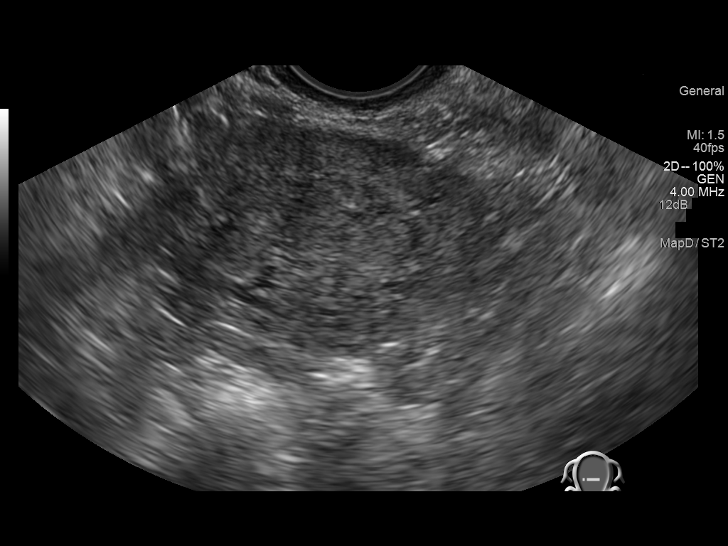
[im 54/93]
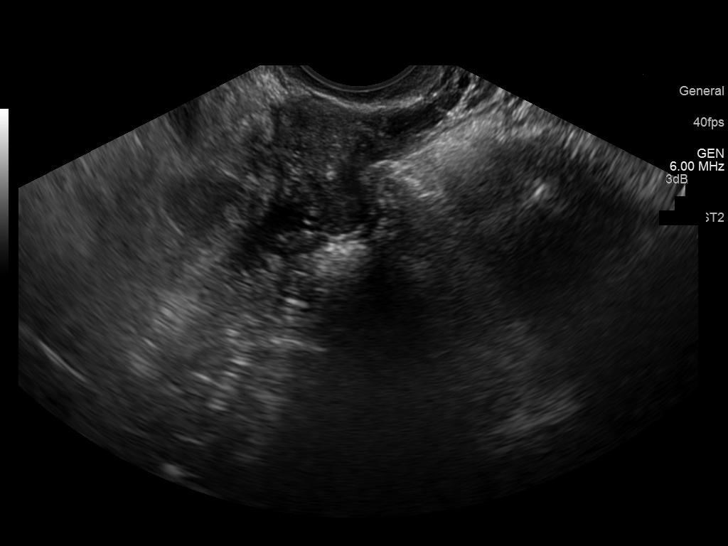
[im 62/93]
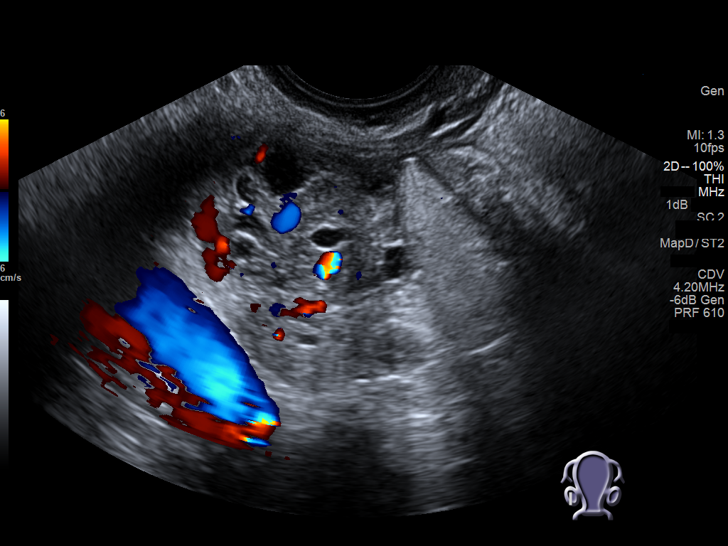
[im 70/93]
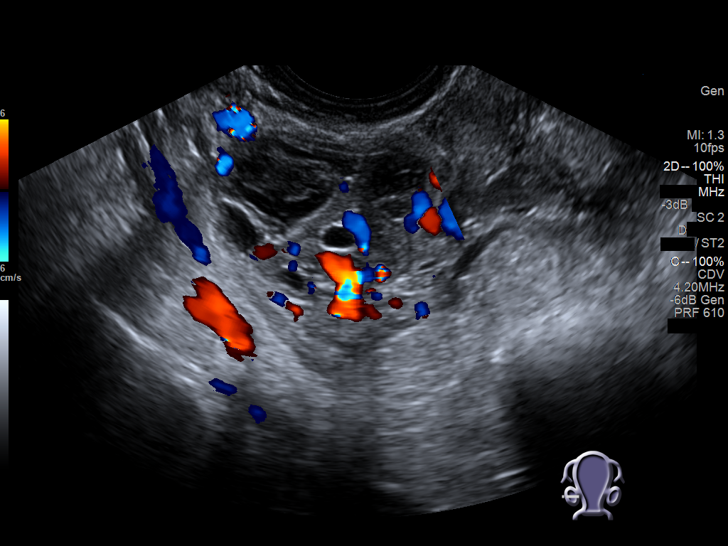
[im 77/93]
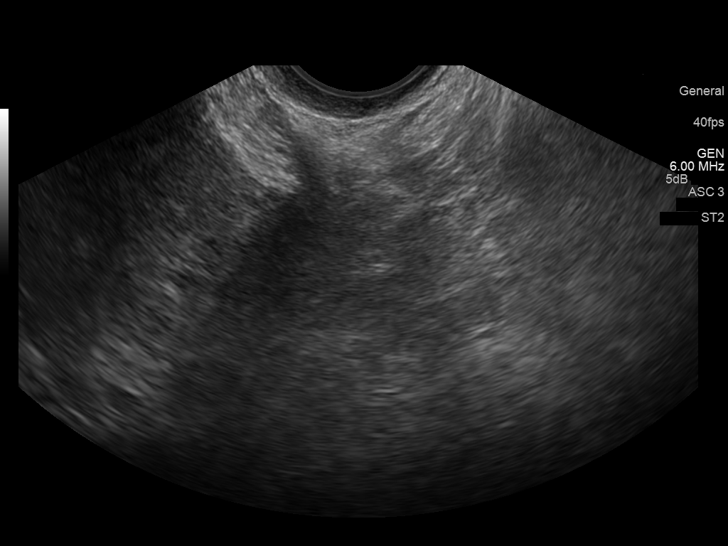
[im 85/93]
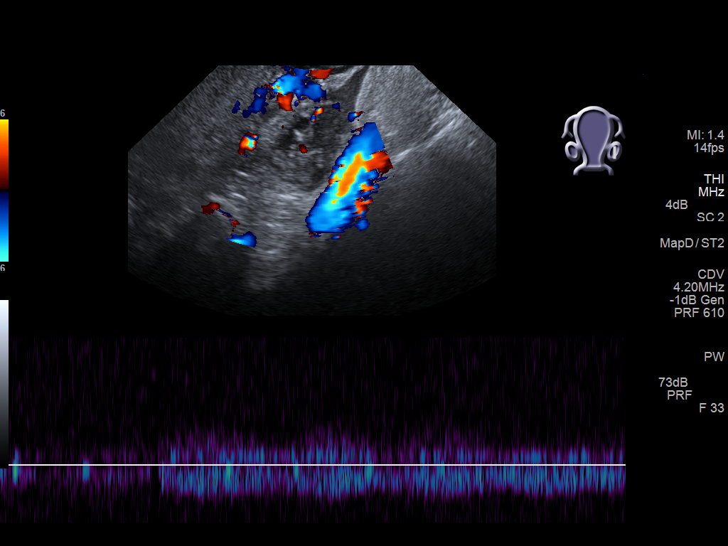
[im 93/93]
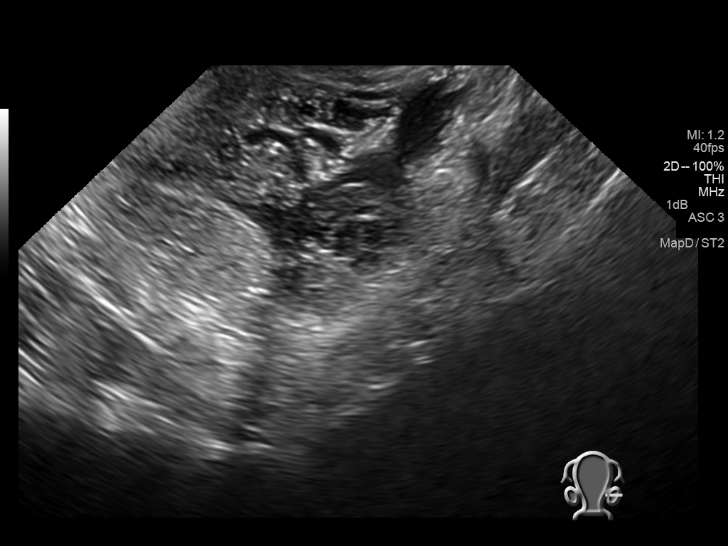

[13 of 25 positions shown; findings below may reference images not displayed]

FINDINGS: Uterus

Measurements: 9.4 x 4.4 x 4.7 cm and anteverted.. No fibroids or
other mass visualized.

Endometrium

Thickness: 7 mm.  No focal abnormality visualized.

Right ovary

Measurements: 3.9 x 2.9 x 3.2 cm. Normal appearance/no adnexal mass.

Left ovary

Measurements: 3.2 x 1.7 x 2.3 cm. Normal appearance/no adnexal mass.

Pulsed Doppler evaluation of both ovaries demonstrates normal
low-resistance arterial and venous waveforms.

Other findings

Small amount of free pelvic fluid may be physiologic.
IMPRESSION: Small amount of free pelvic fluid which may be physiologic.

Otherwise unremarkable pelvic ultrasound. Normal ovaries. No
evidence of ovarian torsion.

## 2017-12-03 IMAGING — US US OB TRANSVAGINAL
1 series · 14 of 28 positions shown · non-contrast
Comparison: None.

CLINICAL DATA: Vaginal bleeding. The patient was diagnosed as
having a miscarriage 4 days ago and was given Misoprostol.
Quantitative HCG 10,650

EXAM:
OBSTETRIC <14 WK US AND TRANSVAGINAL OB US
TECHNIQUE: Both transabdominal and transvaginal ultrasound examinations were
performed for complete evaluation of the gestation as well as the
maternal uterus, adnexal regions, and pelvic cul-de-sac.
Transvaginal technique was performed to assess early pregnancy.

[Series 1: us ob transvaginal · 0.20mm/px · 14 of 106 slices shown]
[im 4/106]
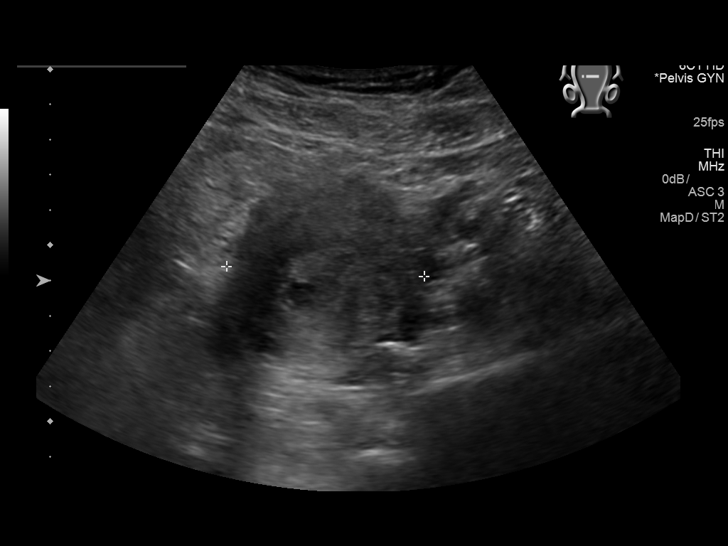
[im 12/106]
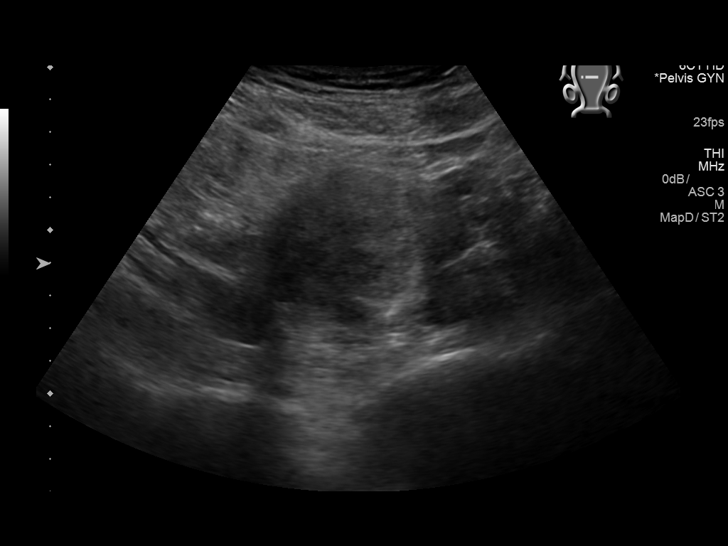
[im 20/106]
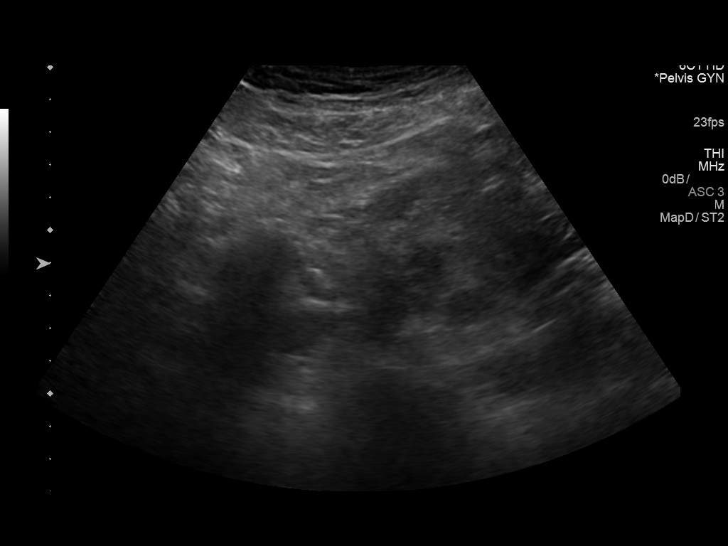
[im 28/106]
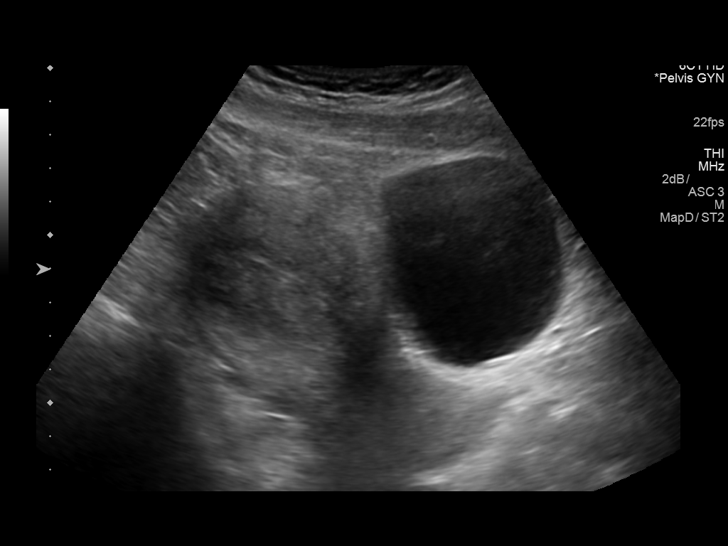
[im 36/106]
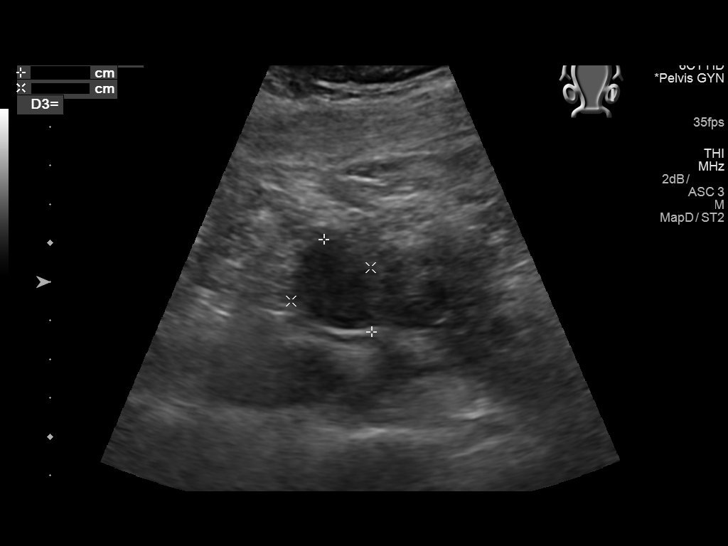
[im 43/106]
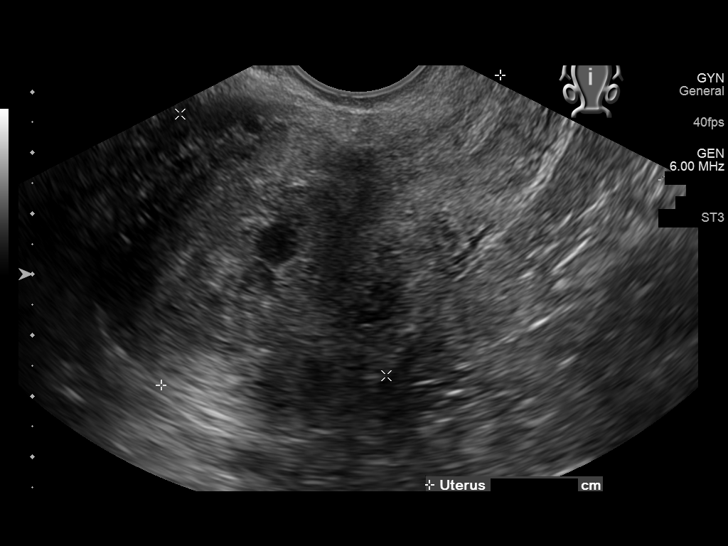
[im 51/106]
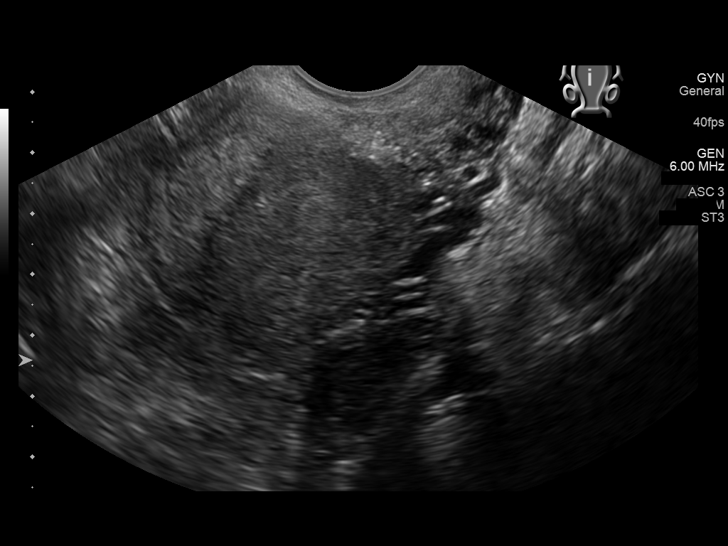
[im 59/106]
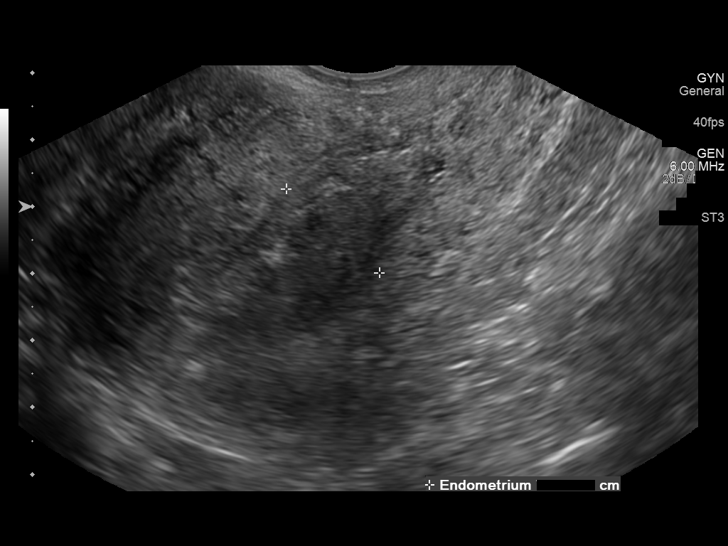
[im 67/106]
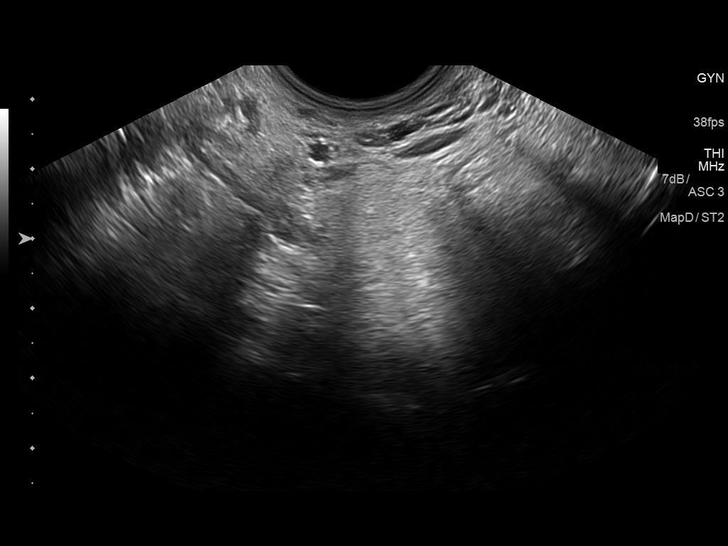
[im 74/106]
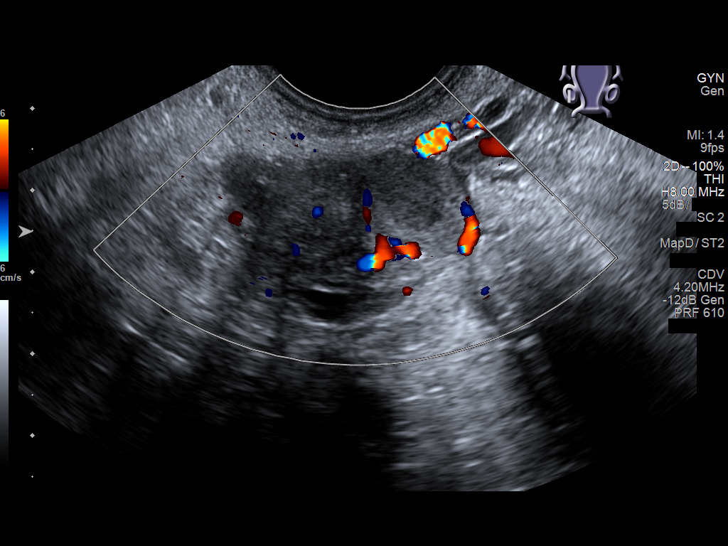
[im 82/106]
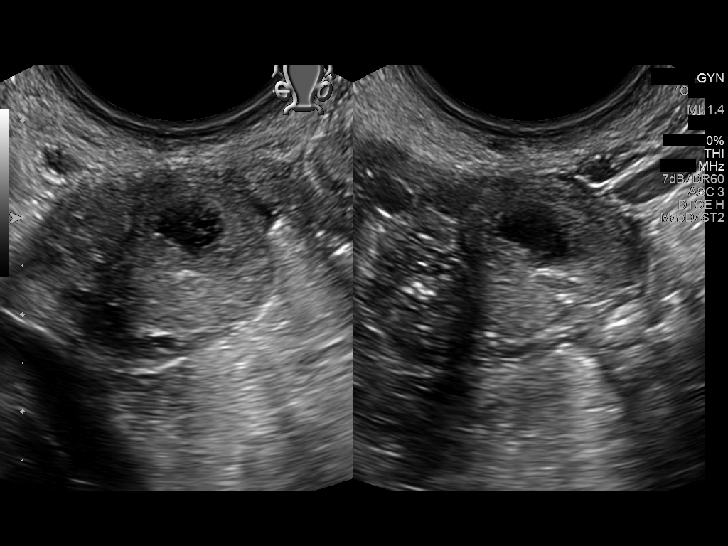
[im 90/106]
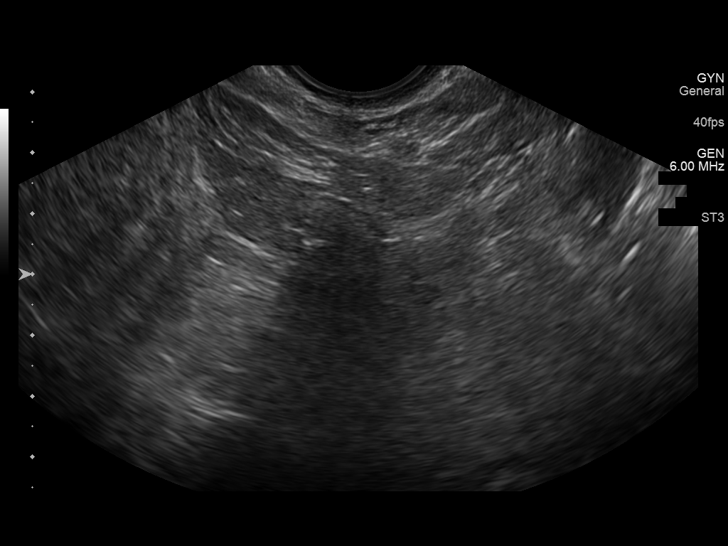
[im 98/106]
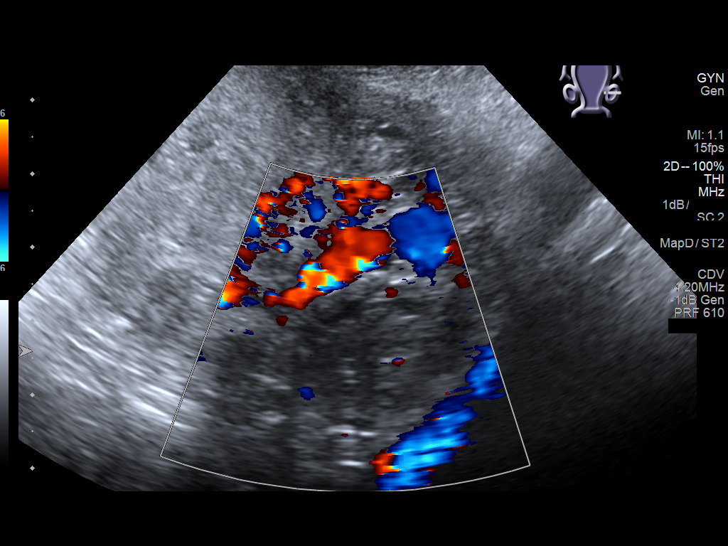
[im 106/106]
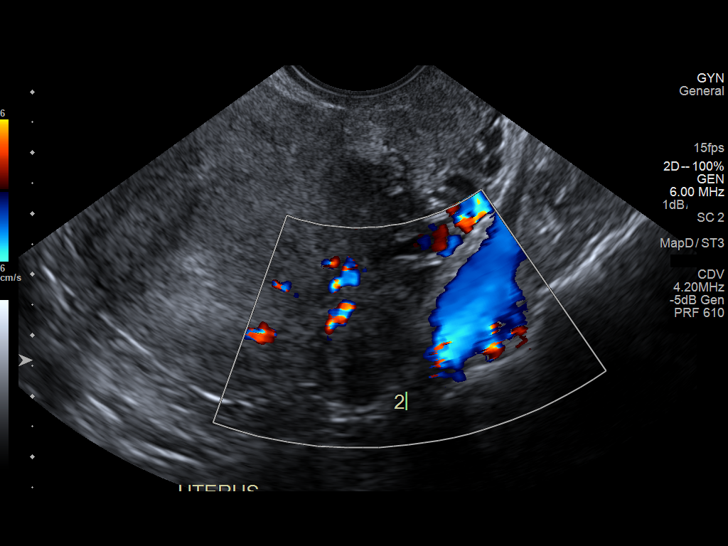

[14 of 28 positions shown; findings below may reference images not displayed]

FINDINGS: Intrauterine gestational sac: The endometrium is abnormally
thickened at 1.9 cm and appears heterogeneous. A hypoattenuating
focus with irregular borders measures 1.0 by 0.8 cm and may be a
gestational sac. Alternatively, a second hypoattenuating focus
measuring 0.7 by 0.7 cm is identified. These areas could be
gestational sacs or focal fluid/hemorrhage.

Maternal uterus/adnexae: Unremarkable.
IMPRESSION: Findings most compatible with abortion in progress. Recommend
followup quantitative serial HCG. Repeat ultrasound in 4-6 days is
also recommended.

## 2018-07-15 ENCOUNTER — Ambulatory Visit
Admission: RE | Admit: 2018-07-15 | Discharge: 2018-07-15 | Disposition: A | Discharge status: Home / Self Care | Payer: Managed Care, Other (non HMO) | Source: Ambulatory Visit | Attending: Obstetrics and Gynecology | Admitting: Obstetrics and Gynecology

## 2018-07-15 ENCOUNTER — Other Ambulatory Visit: Payer: Self-pay

## 2018-07-15 ENCOUNTER — Other Ambulatory Visit (HOSPITAL_COMMUNITY): Payer: Self-pay | Admitting: Obstetrics and Gynecology

## 2018-07-15 ENCOUNTER — Other Ambulatory Visit: Payer: Self-pay | Admitting: Obstetrics and Gynecology

## 2018-07-15 DIAGNOSIS — O3680X1 Pregnancy with inconclusive fetal viability, fetus 1: Secondary | ICD-10-CM | POA: Diagnosis not present

## 2018-07-22 LAB — OB RESULTS CONSOLE VARICELLA ZOSTER ANTIBODY, IGG: Varicella: IMMUNE

## 2018-07-22 LAB — OB RESULTS CONSOLE HEPATITIS B SURFACE ANTIGEN: Hepatitis B Surface Ag: NEGATIVE

## 2018-07-22 LAB — OB RESULTS CONSOLE RUBELLA ANTIBODY, IGM: Rubella: IMMUNE

## 2018-11-18 ENCOUNTER — Observation Stay
Admission: EM | Admit: 2018-11-18 | Discharge: 2018-11-18 | Disposition: A | Discharge status: Home / Self Care | Payer: Managed Care, Other (non HMO) | Attending: Obstetrics and Gynecology | Admitting: Obstetrics and Gynecology

## 2018-11-18 ENCOUNTER — Other Ambulatory Visit: Payer: Self-pay

## 2018-11-18 DIAGNOSIS — R109 Unspecified abdominal pain: Secondary | ICD-10-CM | POA: Diagnosis present

## 2018-11-18 DIAGNOSIS — O09513 Supervision of elderly primigravida, third trimester: Secondary | ICD-10-CM | POA: Diagnosis present

## 2018-11-18 DIAGNOSIS — R1032 Left lower quadrant pain: Secondary | ICD-10-CM | POA: Diagnosis not present

## 2018-11-18 DIAGNOSIS — O99333 Smoking (tobacco) complicating pregnancy, third trimester: Secondary | ICD-10-CM | POA: Insufficient documentation

## 2018-11-18 DIAGNOSIS — O26893 Other specified pregnancy related conditions, third trimester: Secondary | ICD-10-CM | POA: Insufficient documentation

## 2018-11-18 DIAGNOSIS — F17200 Nicotine dependence, unspecified, uncomplicated: Secondary | ICD-10-CM | POA: Diagnosis not present

## 2018-11-18 DIAGNOSIS — Z791 Long term (current) use of non-steroidal anti-inflammatories (NSAID): Secondary | ICD-10-CM | POA: Insufficient documentation

## 2018-11-18 DIAGNOSIS — Z3A28 28 weeks gestation of pregnancy: Secondary | ICD-10-CM | POA: Diagnosis not present

## 2018-11-18 DIAGNOSIS — D259 Leiomyoma of uterus, unspecified: Secondary | ICD-10-CM | POA: Insufficient documentation

## 2018-11-18 DIAGNOSIS — Z79899 Other long term (current) drug therapy: Secondary | ICD-10-CM | POA: Diagnosis not present

## 2018-11-18 DIAGNOSIS — O3413 Maternal care for benign tumor of corpus uteri, third trimester: Secondary | ICD-10-CM | POA: Insufficient documentation

## 2018-11-18 DIAGNOSIS — O10913 Unspecified pre-existing hypertension complicating pregnancy, third trimester: Secondary | ICD-10-CM | POA: Insufficient documentation

## 2018-11-18 LAB — URINALYSIS, ROUTINE W REFLEX MICROSCOPIC
Bilirubin Urine: NEGATIVE
Glucose, UA: NEGATIVE mg/dL
Ketones, ur: 5 mg/dL — AB
Leukocytes,Ua: NEGATIVE
Nitrite: NEGATIVE
Protein, ur: NEGATIVE mg/dL
Specific Gravity, Urine: 1.01 (ref 1.005–1.030)
pH: 7 (ref 5.0–8.0)

## 2018-11-18 LAB — WET PREP, GENITAL
Clue Cells Wet Prep HPF POC: NONE SEEN
Sperm: NONE SEEN
Trich, Wet Prep: NONE SEEN
Yeast Wet Prep HPF POC: NONE SEEN

## 2018-11-18 LAB — CHLAMYDIA/NGC RT PCR (ARMC ONLY)
Chlamydia Tr: NOT DETECTED
N gonorrhoeae: NOT DETECTED

## 2018-11-18 NOTE — Discharge Summary (Signed)
Elizabeth Bryan is a 38 y.o. female. She is at [redacted]w[redacted]d gestation. Patient's last menstrual period was 05/06/2018. Estimated Date of Delivery: 02/10/19  Prenatal care site: [facility]  Current pregnancy complicated by:  1. Hx prior CS for arrest of descent, macrosomic infant 2. Advanced maternal age 38. CHTN, no meds 4. Fibroids x 4, 2.5-3.8cm  Chief complaint: abdominal pain  Location: left lower abdominal pain Onset/timing: since last night Duration: intermittent Quality: sharp shooting pains Severity: has to stop moving and hold her belly when pain occurs.  Aggravating or alleviating conditions: worse with movements/walking.  Associated signs/symptoms: no dysuria, dyspareunia.  Context: [redacted]wks pregnant, next [facility] visit scheduled 9/4  S: Resting comfortably. no CTX, no VB.no LOF,  Active fetal movement. Denies: HA, visual changes, SOB, or RUQ/epigastric pain  Maternal Medical History:   Past Medical History:  Diagnosis Date  . Ovarian cyst     Past Surgical History:  Procedure Laterality Date  . CESAREAN SECTION    . DILATION AND EVACUATION N/A 05/14/2015   Procedure: DILATATION AND EVACUATION;  Surgeon: Gae Dry, MD;  Location: [facility] ORS;  Service: Gynecology;  Laterality: N/A;    Allergies  Allergen Reactions  . Chocolate Hives    Prior to Admission medications   Medication Sig Start Date End Date Taking? Authorizing Provider  Prenatal Vit-Fe Fumarate-FA (PRENATAL MULTIVITAMIN) TABS tablet Take 1 tablet by mouth daily at 12 noon.   Yes [provider]  doxycycline (VIBRAMYCIN) 100 MG capsule Take 1 capsule (100 mg total) by mouth 2 (two) times daily. 05/14/15   Gae Dry, MD  ibuprofen (ADVIL,MOTRIN) 400 MG tablet Take 1 tablet (400 mg total) by mouth every 6 (six) hours as needed. 05/14/15   Gae Dry, MD  ibuprofen (ADVIL,MOTRIN) 600 MG tablet Take 600 mg by mouth every 6 (six) hours as needed. for pain 05/09/15   [provider]  methylergonovine (METHERGINE) 0.2 MG tablet Take 1 tablet (0.2 mg total) by mouth 4 (four) times daily. 05/14/15   Gae Dry, MD  misoprostol (CYTOTEC) 200 MCG tablet Take 4 tablets by mouth every 6 (six) hours as needed. 05/09/15   [provider]  oxyCODONE-acetaminophen (PERCOCET/ROXICET) 5-325 MG tablet Take 1 tablet by mouth every 4 (four) hours as needed. for pain 05/09/15   [provider]      Social History: She  reports that she has been smoking. She does not have any smokeless tobacco history on file. She reports current alcohol use.  Family History:  no history of gyn cancers  Review of Systems: A full review of systems was performed and negative except as noted in the HPI.     O:  LMP 05/06/2018  No results found for this or any previous visit (from the past 48 hour(s)).   Constitutional: NAD, AAOx3  HE/ENT: extraocular movements grossly intact, moist mucous membranes CV: RRR PULM: nl respiratory effort, CTABL     Abd: gravid, non-tender, non-distended, soft      Ext: Non-tender, Nonedematous   Psych: mood appropriate, speech normal Pelvic: SSE done: mod amt white dc, no erythema. Cervix visually closed and long. SVE firm closed anterior cervix. Wet prep and GC/Ct sent.   Fetal  monitoring: Cat I Appropriate for GA Baseline: 140bpm Variability: moderate Accelerations: present x >2, 10/*10bpm Decelerations absent Time 65mins    A/P: 38 y.o. [redacted]w[redacted]d here for antenatal surveillance for Ligament pain in pregnancy  Principle Diagnosis: High risk pregnancy in third trimester,  [redacted]wks gestation, round ligament pain.    Preterm labor: not present.   Fetal Wellbeing: Reassuring Cat 1 tracing with reactive NST   Reviewed comfort measures, including hydration, tylenol and support belt.   D/c home stable, precautions reviewed, follow-up as scheduled.    Francetta Found, CNM 11/18/2018  12:07 PM

## 2018-11-18 NOTE — OB Triage Note (Signed)
Abd. Cramping on left side of abdomen that started yesterday around 5pm. No vaginal discharge, bleeding or LOF. She has been feeling the baby move.

## 2018-12-24 ENCOUNTER — Encounter: Payer: Managed Care, Other (non HMO) | Attending: Obstetrics and Gynecology | Admitting: *Deleted

## 2018-12-24 ENCOUNTER — Other Ambulatory Visit: Payer: Self-pay

## 2018-12-24 ENCOUNTER — Encounter: Payer: Self-pay | Admitting: *Deleted

## 2018-12-24 VITALS — BP 112/80 | Ht 64.0 in | Wt 207.3 lb

## 2018-12-24 DIAGNOSIS — O24410 Gestational diabetes mellitus in pregnancy, diet controlled: Secondary | ICD-10-CM | POA: Diagnosis not present

## 2018-12-24 DIAGNOSIS — Z3A00 Weeks of gestation of pregnancy not specified: Secondary | ICD-10-CM | POA: Insufficient documentation

## 2018-12-24 NOTE — Patient Instructions (Signed)
Read booklet on Gestational Diabetes Follow Gestational Meal Planning Guidelines  Avoid sugar sweetened drinks (juice, soda) Allow 2-3 hours between meals and snacks Complete a 3 Day Food Record and bring to next appointment Check blood sugars 4 x day - before breakfast and 2 hrs after every meal and record  Bring blood sugar log to all appointments Call MD for prescription for meter strips and lancets Strips One Touch Verio  Lancets   One Touch Delica Purchase urine ketone strips if ordered by MD and check urine ketones every am:  If + increase bedtime snack to 1 protein and 2 carbohydrate servings Walk 20-30 minutes at least 5 x week if permitted by MD

## 2018-12-24 NOTE — Progress Notes (Signed)
Diabetes Self-Management Education  Visit Type: First/Initial  Appt. Start Time: 1330 Appt. End Time: 1520  12/24/2018  [name], identified by name and date of birth, is a 38 y.o. female with a diagnosis of Diabetes: Gestational Diabetes.   ASSESSMENT  Blood pressure 112/80, height 5\' 4"  (1.626 m), weight 207 lb 4.8 oz (94 kg), last menstrual period 05/06/2018, estimated date of delivery 02/10/2019 Body mass index is 35.58 kg/m.  Diabetes Self-Management Education - 12/24/18 1640      Visit Information   Visit Type  First/Initial      Initial Visit   Diabetes Type  Gestational Diabetes    Are you currently following a meal plan?  No    Are you taking your medications as prescribed?  Yes    Date Diagnosed  last month      Health Coping   How would you rate your overall health?  Fair      Psychosocial Assessment   Patient Belief/Attitude about Diabetes  Motivated to manage diabetes    Self-care barriers  None    Self-management support  Doctor's office;Family    Patient Concerns  Nutrition/Meal planning;Weight Control    Special Needs  None    Preferred Learning Style  Visual;Hands on    Learning Readiness  Ready    How often do you need to have someone help you when you read instructions, pamphlets, or other written materials from your doctor or pharmacy?  1 - Never    What is the last grade level you completed in school?  12th      Pre-Education Assessment   Patient understands the diabetes disease and treatment process.  Needs Instruction    Patient understands incorporating nutritional management into lifestyle.  Needs Instruction    Patient undertands incorporating physical activity into lifestyle.  Needs Instruction    Patient understands using medications safely.  Needs Instruction    Patient understands monitoring blood glucose, interpreting and using results  Needs Instruction    Patient understands prevention, detection, and treatment of acute  complications.  Needs Instruction    Patient understands prevention, detection, and treatment of chronic complications.  Needs Instruction    Patient understands how to develop strategies to address psychosocial issues.  Needs Instruction    Patient understands how to develop strategies to promote health/change behavior.  Needs Instruction      Complications   How often do you check your blood sugar?  0 times/day (not testing)   Provided One Touch Verio Flex meter and instructed on use. BG upon return demonstration was 68 mg/dL at 3:10 pm - 5 hrs pp. Pt ate a snack after BG reading.   Have you had a dilated eye exam in the past 12 months?  No    Have you had a dental exam in the past 12 months?  No    Are you checking your feet?  No      Dietary Intake   Breakfast  bacon, egg and cheese biscuit    Snack (morning)  Pt reports snacking "all day" - chips, crackers, watermelon    Lunch  lasagna, pasta, rice, sub with meat    Dinner  beef, pork, chicken, fish with potaotes, peas, beans, pasta, rice, green beans, broccoli, salads    Beverage(s)  water, juice, soda      Exercise   Exercise Type  ADL's      Patient Education   Previous Diabetes Education  No    Disease  state   Definition of diabetes, type 1 and 2, and the diagnosis of diabetes;Factors that contribute to the development of diabetes    Nutrition management   Role of diet in the treatment of diabetes and the relationship between the three main macronutrients and blood glucose level;Food label reading, portion sizes and measuring food.;Reviewed blood glucose goals for pre and post meals and how to evaluate the patients' food intake on their blood glucose level.    Physical activity and exercise   Role of exercise on diabetes management, blood pressure control and cardiac health.    Medications  Other (comment)   limited use of oral medications during pregnancy and possibility of insulin   Monitoring  Taught/evaluated SMBG  meter.;Purpose and frequency of SMBG.;Taught/discussed recording of test results and interpretation of SMBG.;Ketone testing, when, how.    Acute complications  Taught treatment of hypoglycemia - the 15 rule.    Chronic complications  Relationship between chronic complications and blood glucose control    Psychosocial adjustment  Identified and addressed patients feelings and concerns about diabetes    Preconception care  Pregnancy and GDM  Role of pre-pregnancy blood glucose control on the development of the fetus;Reviewed with patient blood glucose goals with pregnancy;Role of family planning for patients with diabetes      Individualized Goals (developed by patient)   Reducing Risk Lose weight     Outcomes   Expected Outcomes  Demonstrated interest in learning. Expect positive outcomes    Future DMSE  2 wks       Individualized Plan for Diabetes Self-Management Training:   Learning Objective:  Patient will have a greater understanding of diabetes self-management. Patient education plan is to attend individual and/or group sessions per assessed needs and concerns.   Plan:   Patient Instructions  Read booklet on Gestational Diabetes Follow Gestational Meal Planning Guidelines  Avoid sugar sweetened drinks (juice, soda) Allow 2-3 hours between meals and snacks Complete a 3 Day Food Record and bring to next appointment Check blood sugars 4 x day - before breakfast and 2 hrs after every meal and record  Bring blood sugar log to all appointments Call MD for prescription for meter strips and lancets Strips One Touch Verio  Lancets   One Touch Delica Purchase urine ketone strips if ordered by MD and check urine ketones every am:  If + increase bedtime snack to 1 protein and 2 carbohydrate servings Walk 20-30 minutes at least 5 x week if permitted by MD  Expected Outcomes:  Demonstrated interest in learning. Expect positive outcomes  Education material provided:  Gestational  Booklet Gestational Meal Planning Guidelines Simple Meal Plan Viewed Gestational Diabetes Video Meter = One Touch Verio Flex 3 Day Food Record Goals for a Healthy Pregnancy  If problems or questions, patient to contact team via:  Johny Drilling, RN, [place], CDE [phone]  Future DSME appointment: 2 wks  January 07, 2019 with the dietitian

## 2018-12-30 NOTE — Progress Notes (Signed)
[facility]  [address], [address] [place], [place] [address] Phone: [phone]  Fax: [phone]   Clinic Day:  12/31/2018  Referring physician: Francetta Found, CNM  Chief Complaint: Elizabeth Bryan is a 38 y.o. female currently [redacted] weeks pregnant with anemia who is referred in consultation by Hassan Buckler, CNM for assessment and management.   HPI:   The patient denies any history of anemia.  She found out she was pregnant on 06/21/2018.  Her baby is due 02/10/2019.  She was seen on 12/08/2018 for regular OB follow-up.  She was 30 weeks and 6 days.  She was noted to have chronic hypertension.  She has a history of C-section for arrest of descent and failed induction of labor.  CBC revealed a hematocrit 28.9, hemoglobin 8.7, MCV 75.7, platelets 332,000, WBC 11,200 and ANC 8760.  Prior CBCs: 03/17/2007:  Hematocrit 31.0, hemoglobin 10.5, MCV 77, platelets 209,000, WBC 11,000. 03/08/2010:  Hematocrit 43.0, hemoglobin 14.5, MCV 88, platelets 260,000, WBC 10,300. 01/11/2012:  Hematocrit 41.0, hemoglobin 14.0, MCV 90.0, platelets 222,000, WBC 10,200. 01/09/2015:  Hematocrit 41.2, hemoglobin 13.7, MCV 88.1, platelets 233,000, WBC 10,800. 05/14/2015:  Hematocrit 17.9, hemoglobin 6.2, MCV 87.1, platelets 221,000, WBC 11,100.   07/22/2018:  Hematocrit 36.7, hemoglobin 12.1, MCV 84, platelets 290,000, WBC 11,100.  Notes indicate she presented with vaginal bleeding on 05/14/2015.  She had a miscarriage followed by dilatation and curettage.  She received 1 unit of PRBCs.    Diet is now different since recent diagnosis of gestational diabetes. She eats more vegetables than fruits.  She is eating dark leafy greens.  She eats meat almost everyday. She has ice pica for the past month and restless legs.  She denies any bleeding except for slight blood on tissue with wiping her nose. Her multivitamin has no iron.  She recently switched to a supplement with iron.    Symptomatically, she feels fine,  but has chronic fatigue thought due to pregnancy. For last month, weight has been up and down. Fetal ultrasound notes that the baby is growing fine. Patient notes some headaches.  She denies shortness of breath, cough, wheezing, fever, chills, nausea, vomiting, abdominal pain, black or bloody stool.   Family history is notable for her mother and sister who have anemia.   Her mother has has ESRD.  The father of the baby and her son have sickle cell trait.  Per her report, sickle cell testing was negative at [facility] 2016/2017.   Past Medical History:  Diagnosis Date  . Anemia   . Gestational diabetes   . Ovarian cyst     Past Surgical History:  Procedure Laterality Date  . CESAREAN SECTION    . DILATION AND EVACUATION N/A 05/14/2015   Procedure: DILATATION AND EVACUATION;  Surgeon: Gae Dry, MD;  Location: [facility] ORS;  Service: Gynecology;  Laterality: N/A;    Family History  Problem Relation Age of Onset  . Diabetes Mother   . Diabetes Maternal Aunt   . Diabetes Maternal Grandmother     Social History:  reports that she quit smoking about 6 months ago. Her smoking use included cigarettes and cigars. She has a 16.00 pack-year smoking history. She has never used smokeless tobacco. She reports previous alcohol use. No history on file for drug.  Her first child was born 03/19/2007.   She is due 02/10/2019.  She is having a boy Press photographer).  She is working as Radiographer, therapeutic at [employer].The patient is alone  today.  Allergies:  Allergies  Allergen Reactions  . Chocolate Hives    Current Medications: Current Outpatient Medications  Medication Sig Dispense Refill  . aspirin EC 81 MG tablet Take 81 mg by mouth daily.    . Prenatal Vit-Fe Fumarate-FA (PRENATAL MULTIVITAMIN) TABS tablet Take 1 tablet by mouth daily at 12 noon.     No current facility-administered medications for this visit.     Review of Systems  Constitutional: Positive  for malaise/fatigue. Negative for chills, diaphoresis and fever. Weight loss: up and down.       Feels "fine".  HENT: Negative.  Negative for congestion, ear pain, hearing loss, nosebleeds (blood on tissue with wiping), sinus pain, sore throat and tinnitus.   Eyes: Negative.  Negative for blurred vision, double vision and pain.  Respiratory: Negative.  Negative for cough, hemoptysis, sputum production, shortness of breath and wheezing.   Cardiovascular: Negative.  Negative for chest pain, palpitations, leg swelling and PND.  Gastrointestinal: Negative.  Negative for abdominal pain, blood in stool, constipation, diarrhea, heartburn, melena, nausea and vomiting.  Genitourinary: Negative.  Negative for dysuria, frequency, hematuria and urgency.  Musculoskeletal: Negative for back pain, falls, joint pain, myalgias and neck pain.  Skin: Negative.  Negative for itching and rash.  Neurological: Positive for headaches. Negative for dizziness, tingling, sensory change, speech change, focal weakness and weakness.       Restless legs.  Endo/Heme/Allergies: Negative.  Does not bruise/bleed easily.  Psychiatric/Behavioral: Negative for depression and memory loss. The patient is not nervous/anxious and does not have insomnia.    Performance status (ECOG): 1 - Symptomatic but completely ambulatory  Vitals Blood pressure 139/85, pulse 96, temperature 97.9 F (36.6 C), temperature source Tympanic, resp. rate 18, height 5\' 4"  (1.626 m), weight 209 lb 12.3 oz (95.1 kg), last menstrual period 05/06/2018, SpO2 100 %.   Physical Exam  Constitutional: She is oriented to person, place, and time. She appears well-developed and well-nourished.  HENT:  Head: Normocephalic and atraumatic.  Mouth/Throat: Oropharynx is clear and moist. No oropharyngeal exudate.  Dark hair.  Mask.  Eyes: Pupils are equal, round, and reactive to light. Conjunctivae and EOM are normal. No scleral icterus.  Brown eyes.  Neck: Normal  range of motion. Neck supple. No JVD present.  Cardiovascular: Normal rate, regular rhythm and normal heart sounds. Exam reveals no gallop and no friction rub.  No murmur heard. Pulmonary/Chest: Effort normal and breath sounds normal. She has no wheezes. She has no rales.  Abdominal: Soft. Bowel sounds are normal. She exhibits no mass. There is no abdominal tenderness. There is no rebound and no guarding.  Pregnant.  Musculoskeletal: Normal range of motion.        General: No edema.  Lymphadenopathy:    She has no cervical adenopathy.    She has no axillary adenopathy.       Right: No inguinal adenopathy present.       Left: No inguinal adenopathy present.  Neurological: She is alert and oriented to person, place, and time.  Skin: Skin is warm and dry. No rash noted. No erythema. No pallor.  Psychiatric: She has a normal mood and affect. Her behavior is normal. Judgment and thought content normal.  Nursing note and vitals reviewed.   No visits with results within 3 Day(s) from this visit.  Latest known visit with results is:  Admission on 11/18/2018, Discharged on 11/18/2018  Component Date Value Ref Range Status  . Color, Urine 11/18/2018 YELLOW* YELLOW Final  .  APPearance 11/18/2018 HAZY* CLEAR Final  . Specific Gravity, Urine 11/18/2018 1.010  1.005 - 1.030 Final  . pH 11/18/2018 7.0  5.0 - 8.0 Final  . Glucose, UA 11/18/2018 NEGATIVE  NEGATIVE mg/dL Final  . Hgb urine dipstick 11/18/2018 MODERATE* NEGATIVE Final  . Bilirubin Urine 11/18/2018 NEGATIVE  NEGATIVE Final  . Ketones, ur 11/18/2018 5* NEGATIVE mg/dL Final  . Protein, ur 11/18/2018 NEGATIVE  NEGATIVE mg/dL Final  . Nitrite 11/18/2018 NEGATIVE  NEGATIVE Final  . Chalmers Guest 11/18/2018 NEGATIVE  NEGATIVE Final  . RBC / HPF 11/18/2018 0-5  0 - 5 RBC/hpf Final  . WBC, UA 11/18/2018 0-5  0 - 5 WBC/hpf Final  . Bacteria, UA 11/18/2018 RARE* NONE SEEN Final  . Squamous Epithelial / LPF 11/18/2018 0-5  0 - 5 Final  .  Mucus 11/18/2018 PRESENT   Final  . Amorphous Crystal 11/18/2018 PRESENT   Final   Performed at [facility], [address], [place], [place] [address]  . Specimen source GC/Chlam 11/18/2018 ENDOCERVICAL   Final  . Chlamydia Tr 11/18/2018 NOT DETECTED  NOT DETECTED Final  . N gonorrhoeae 11/18/2018 NOT DETECTED  NOT DETECTED Final   Comment: (NOTE) This CT/NG assay has not been evaluated in patients with a history of  hysterectomy. Performed at [facility], [address], [place], [place] [address]   . Yeast Wet Prep HPF POC 11/18/2018 NONE SEEN  NONE SEEN Final  . Trich, Wet Prep 11/18/2018 NONE SEEN  NONE SEEN Final  . Clue Cells Wet Prep HPF POC 11/18/2018 NONE SEEN  NONE SEEN Final  . WBC, Wet Prep HPF POC 11/18/2018 FEW* NONE SEEN Final  . Sperm 11/18/2018 NONE SEEN   Final   Performed at [facility], [address] [place], [place] [address]    Assessment:  Elizabeth Bryan is a 38 y.o. female currently [redacted] weeks pregnant with microcytic anemia felt secondary to iron deficiency.  Diet is good.  She has ice pica.  She denies any bleeding.  She was taking a MVI without iron.  Labs on 12/08/2018 revealed a hematocrit 28.9, hemoglobin 8.7, MCV 75.7, platelets 332,000, WBC 11,200 and ANC 8760.  Symptomatically, she is fatigued.  Exam is unremarkable.  Plan: 1.   Labs today:  CBC with diff, CMP, ferritin, iron studies, retic. 2.   Microcytic anemia  Etiology felt secondary to iron deficiency.  Discuss trial of oral iron vs IV iron.   Potential side effects of IV iron reviewed.   Information provided.   Preauth Venofer.  Patient to begin ferrous sulfate 325 mg po q day with OJ or vitamin C.   If tolerated, increase to BID. 3.    RTC in 1 week for MD assessment, labs (CBC, retic), and +/- Venofer.  I discussed the assessment and treatment plan with the patient.  The patient was provided an opportunity to ask questions and all were answered.  The  patient agreed with the plan and demonstrated an understanding of the instructions.  The patient was advised to call back if the symptoms worsen or if the condition fails to improve as anticipated.   Melissa C. Mike Gip, MD, PhD    12/31/2018, 9:47 AM  I, Samul Dada, am acting as scribe for [employer]. Mike Gip, MD, PhD.  I, Melissa C. Mike Gip, MD, have reviewed the above documentation for accuracy and completeness, and I agree with the above.

## 2018-12-31 ENCOUNTER — Inpatient Hospital Stay: Payer: Managed Care, Other (non HMO) | Attending: Hematology and Oncology | Admitting: Hematology and Oncology

## 2018-12-31 ENCOUNTER — Telehealth: Payer: Self-pay

## 2018-12-31 ENCOUNTER — Encounter: Payer: Self-pay | Admitting: Hematology and Oncology

## 2018-12-31 ENCOUNTER — Other Ambulatory Visit: Payer: Self-pay

## 2018-12-31 ENCOUNTER — Inpatient Hospital Stay: Payer: Managed Care, Other (non HMO)

## 2018-12-31 VITALS — BP 139/85 | HR 96 | Temp 97.9°F | Resp 18 | Ht 64.0 in | Wt 209.8 lb

## 2018-12-31 DIAGNOSIS — O99013 Anemia complicating pregnancy, third trimester: Secondary | ICD-10-CM | POA: Insufficient documentation

## 2018-12-31 DIAGNOSIS — Z7982 Long term (current) use of aspirin: Secondary | ICD-10-CM | POA: Insufficient documentation

## 2018-12-31 DIAGNOSIS — D509 Iron deficiency anemia, unspecified: Secondary | ICD-10-CM | POA: Diagnosis not present

## 2018-12-31 DIAGNOSIS — R5381 Other malaise: Secondary | ICD-10-CM | POA: Insufficient documentation

## 2018-12-31 DIAGNOSIS — Z3A35 35 weeks gestation of pregnancy: Secondary | ICD-10-CM | POA: Insufficient documentation

## 2018-12-31 DIAGNOSIS — R5382 Chronic fatigue, unspecified: Secondary | ICD-10-CM | POA: Insufficient documentation

## 2018-12-31 DIAGNOSIS — Z79899 Other long term (current) drug therapy: Secondary | ICD-10-CM | POA: Diagnosis not present

## 2018-12-31 DIAGNOSIS — I10 Essential (primary) hypertension: Secondary | ICD-10-CM | POA: Diagnosis not present

## 2018-12-31 DIAGNOSIS — E8809 Other disorders of plasma-protein metabolism, not elsewhere classified: Secondary | ICD-10-CM | POA: Diagnosis not present

## 2018-12-31 DIAGNOSIS — R519 Headache, unspecified: Secondary | ICD-10-CM | POA: Diagnosis not present

## 2018-12-31 DIAGNOSIS — F5089 Other specified eating disorder: Secondary | ICD-10-CM | POA: Diagnosis not present

## 2018-12-31 DIAGNOSIS — Z87891 Personal history of nicotine dependence: Secondary | ICD-10-CM | POA: Diagnosis not present

## 2018-12-31 DIAGNOSIS — G2581 Restless legs syndrome: Secondary | ICD-10-CM | POA: Diagnosis not present

## 2018-12-31 LAB — CBC WITH DIFFERENTIAL/PLATELET
Abs Immature Granulocytes: 0.12 10*3/uL — ABNORMAL HIGH (ref 0.00–0.07)
Basophils Absolute: 0 10*3/uL (ref 0.0–0.1)
Basophils Relative: 0 %
Eosinophils Absolute: 0 10*3/uL (ref 0.0–0.5)
Eosinophils Relative: 0 %
HCT: 23.9 % — ABNORMAL LOW (ref 36.0–46.0)
Hemoglobin: 7.2 g/dL — ABNORMAL LOW (ref 12.0–15.0)
Immature Granulocytes: 1 %
Lymphocytes Relative: 17 %
Lymphs Abs: 1.5 10*3/uL (ref 0.7–4.0)
MCH: 21.1 pg — ABNORMAL LOW (ref 26.0–34.0)
MCHC: 30.1 g/dL (ref 30.0–36.0)
MCV: 69.9 fL — ABNORMAL LOW (ref 80.0–100.0)
Monocytes Absolute: 0.7 10*3/uL (ref 0.1–1.0)
Monocytes Relative: 8 %
Neutro Abs: 6.7 10*3/uL (ref 1.7–7.7)
Neutrophils Relative %: 74 %
Platelets: 283 10*3/uL (ref 150–400)
RBC: 3.42 MIL/uL — ABNORMAL LOW (ref 3.87–5.11)
RDW: 17.5 % — ABNORMAL HIGH (ref 11.5–15.5)
WBC: 9.1 10*3/uL (ref 4.0–10.5)
nRBC: 0.4 % — ABNORMAL HIGH (ref 0.0–0.2)

## 2018-12-31 LAB — COMPREHENSIVE METABOLIC PANEL
ALT: 15 U/L (ref 0–44)
AST: 20 U/L (ref 15–41)
Albumin: 2.9 g/dL — ABNORMAL LOW (ref 3.5–5.0)
Alkaline Phosphatase: 112 U/L (ref 38–126)
Anion gap: 7 (ref 5–15)
BUN: 5 mg/dL — ABNORMAL LOW (ref 6–20)
CO2: 22 mmol/L (ref 22–32)
Calcium: 9.1 mg/dL (ref 8.9–10.3)
Chloride: 108 mmol/L (ref 98–111)
Creatinine, Ser: 0.47 mg/dL (ref 0.44–1.00)
GFR calc Af Amer: >60 mL/min (ref >60)
GFR calc non Af Amer: >60 mL/min (ref >60)
Glucose, Bld: 85 mg/dL (ref 70–99)
Potassium: 3.4 mmol/L — ABNORMAL LOW (ref 3.5–5.1)
Sodium: 137 mmol/L (ref 135–145)
Total Bilirubin: 0.6 mg/dL (ref 0.3–1.2)
Total Protein: 6.2 g/dL — ABNORMAL LOW (ref 6.5–8.1)

## 2018-12-31 LAB — RETICULOCYTES
Immature Retic Fract: 37.5 % — ABNORMAL HIGH (ref 2.3–15.9)
RBC.: 3.43 MIL/uL — ABNORMAL LOW (ref 3.87–5.11)
Retic Count, Absolute: 51.1 10*3/uL (ref 19.0–186.0)
Retic Ct Pct: 1.5 % (ref 0.4–3.1)

## 2018-12-31 LAB — IRON AND TIBC
Iron: 20 ug/dL — ABNORMAL LOW (ref 28–170)
Saturation Ratios: 3 % — ABNORMAL LOW (ref 10.4–31.8)
TIBC: 667 ug/dL — ABNORMAL HIGH (ref 250–450)
UIBC: 647 ug/dL

## 2018-12-31 LAB — FERRITIN: Ferritin: 4 ng/mL — ABNORMAL LOW (ref 11–307)

## 2018-12-31 NOTE — Telephone Encounter (Signed)
Spoke with the patient to inform of her lab result.The patient states she is goin to get some Iron pills over the counter in the mean time. She is schedule to IV Venofer next week, but she was concerned due to the low level of her hbg. The patient was understanding and agreeable.

## 2018-12-31 NOTE — Patient Instructions (Addendum)
Ferrous sulfate 325 mg by mouth once a day. If tolerating well, increase to twice a day. Maximum dose is 1 pill 3 times a day.   Iron Sucrose injection What is this medicine? IRON SUCROSE (AHY ern SOO krohs) is an iron complex. Iron is used to make healthy red blood cells, which carry oxygen and nutrients throughout the body. This medicine is used to treat iron deficiency anemia in people with chronic kidney disease. This medicine may be used for other purposes; ask your health care provider or pharmacist if you have questions. COMMON BRAND NAME(S): Venofer What should I tell my health care provider before I take this medicine? They need to know if you have any of these conditions:  anemia not caused by low iron levels  heart disease  high levels of iron in the blood  kidney disease  liver disease  an unusual or allergic reaction to iron, other medicines, foods, dyes, or preservatives  pregnant or trying to get pregnant  breast-feeding How should I use this medicine? This medicine is for infusion into a vein. It is given by a health care professional in a hospital or clinic setting. Talk to your pediatrician regarding the use of this medicine in children. While this drug may be prescribed for children as young as 2 years for selected conditions, precautions do apply. Overdosage: If you think you have taken too much of this medicine contact a poison control center or emergency room at once. NOTE: This medicine is only for you. Do not share this medicine with others. What if I miss a dose? It is important not to miss your dose. Call your doctor or health care professional if you are unable to keep an appointment. What may interact with this medicine? Do not take this medicine with any of the following medications:  deferoxamine  dimercaprol  other iron products This medicine may also interact with the following medications:  chloramphenicol  deferasirox This list may not  describe all possible interactions. Give your health care provider a list of all the medicines, herbs, non-prescription drugs, or dietary supplements you use. Also tell them if you smoke, drink alcohol, or use illegal drugs. Some items may interact with your medicine. What should I watch for while using this medicine? Visit your doctor or healthcare professional regularly. Tell your doctor or healthcare professional if your symptoms do not start to get better or if they get worse. You may need blood work done while you are taking this medicine. You may need to follow a special diet. Talk to your doctor. Foods that contain iron include: whole grains/cereals, dried fruits, beans, or peas, leafy green vegetables, and organ meats (liver, kidney). What side effects may I notice from receiving this medicine? Side effects that you should report to your doctor or health care professional as soon as possible:  allergic reactions like skin rash, itching or hives, swelling of the face, lips, or tongue  breathing problems  changes in blood pressure  cough  fast, irregular heartbeat  feeling faint or lightheaded, falls  fever or chills  flushing, sweating, or hot feelings  joint or muscle aches/pains  seizures  swelling of the ankles or feet  unusually weak or tired Side effects that usually do not require medical attention (report to your doctor or health care professional if they continue or are bothersome):  diarrhea  feeling achy  headache  irritation at site where injected  nausea, vomiting  stomach upset  tiredness This list may  not describe all possible side effects. Call your doctor for medical advice about side effects. You may report side effects to FDA at 1-800-FDA-1088. Where should I keep my medicine? This drug is given in a hospital or clinic and will not be stored at home. NOTE: This sheet is a summary. It may not cover all possible information. If you have questions  about this medicine, talk to your doctor, pharmacist, or health care provider.  2020 Elsevier/Gold Standard (2010-12-26 17:14:35)

## 2019-01-03 ENCOUNTER — Telehealth: Payer: Self-pay

## 2019-01-03 NOTE — Telephone Encounter (Signed)
To inform the patient that her Ferritin was  3 with her labs.  The patient was understanding and agreeable.

## 2019-01-04 ENCOUNTER — Telehealth: Payer: Self-pay

## 2019-01-04 NOTE — Telephone Encounter (Signed)
Spoke with the patient she had some concerns about her Ferritin , She wanted to know how do she take care of that. I explained to her that it was a part of her Iron studies and per Dr Mike Gip she will need to keep schedule appointment for IV Venofer. The patient was agreeable and understanding to keep schedule appointment.

## 2019-01-06 NOTE — Progress Notes (Signed)
Tried calling patient, no answer and unable to leave message voicemail not set up

## 2019-01-06 NOTE — Progress Notes (Signed)
[facility]  [address] [place], [place] [address] Phone: [phone]  Fax: [phone]   Clinic Day:  01/07/2019  Referring physician: Sharyne Peach, MD  Chief Complaint: Elizabeth Bryan is a 38 y.o. female currently [redacted] weeks pregnant with iron deficiency who is seen for 1 week assessment on oral iron and consideration of IV iron.  HPI: The patient was last seen in the hematology clinic on 12/31/2018 for initial consultation.  She was [redacted] weeks pregnant.  She had a microcytic anemia felt secondary to iron deficiency.  She had been taking multivitamins without iron.  Diet was good.  She denied any bleeding.  We discussed a trial of oral iron and possible IV iron.  Work-up revealed a hematocrit 23.9, hemoglobin 7.2, MCV 69.9, platelets 283,000, and WBC 9100 with an [place] 6700. Ferritin was 4 with an iron saturation of 3% and a TIBC of 667.  Creatinine was 0.47.  LFTs were normal except for an albumin of 2.9. Retic was 1.5%.  During the interim, she has felt the same. She has taken oral iron since her last visit. She takes oral iron once a day, with minimal constipation. She hasn't had any nausea or abdominal pain.   Symptomatically, she is doing fine and the baby is doing well. She would like to try IV iron.  Pulse is up today but believes the reason why is because she is nervous about the IV iron.   Past Medical History:  Diagnosis Date  . Anemia   . Gestational diabetes   . Ovarian cyst     Past Surgical History:  Procedure Laterality Date  . CESAREAN SECTION    . DILATION AND EVACUATION N/A 05/14/2015   Procedure: DILATATION AND EVACUATION;  Surgeon: Gae Dry, MD;  Location: [facility] ORS;  Service: Gynecology;  Laterality: N/A;    Family History  Problem Relation Age of Onset  . Diabetes Mother   . Diabetes Maternal Aunt   . Diabetes Maternal Grandmother     Social History:  reports that she quit smoking about 6 months ago. Her  smoking use included cigarettes and cigars. She has a 16.00 pack-year smoking history. She has never used smokeless tobacco. She reports previous alcohol use. No history on file for drug. She has 1 child in December 19th 2008. She is working as Radiographer, therapeutic at [employer].  She lives in [place].  The patient is alone today.  Allergies:  Allergies  Allergen Reactions  . Chocolate Hives    Current Medications: Current Outpatient Medications  Medication Sig Dispense Refill  . aspirin EC 81 MG tablet Take 81 mg by mouth daily.    . Prenatal Vit-Fe Fumarate-FA (PRENATAL MULTIVITAMIN) TABS tablet Take 1 tablet by mouth daily at 12 noon.     No current facility-administered medications for this visit.     Review of Systems  Constitutional: Positive for malaise/fatigue and weight loss (up and down; down 2 pounds since last visit). Negative for chills, diaphoresis and fever.       Feels "fine".  HENT: Negative.  Negative for congestion, ear pain, hearing loss, nosebleeds, sinus pain, sore throat and tinnitus.   Eyes: Negative.  Negative for blurred vision, double vision and pain.  Respiratory: Negative.  Negative for cough, sputum production and shortness of breath.   Cardiovascular: Negative.  Negative for chest pain, palpitations, leg swelling and PND.  Gastrointestinal: Negative.  Negative for abdominal pain, blood in stool, constipation, diarrhea, heartburn,  melena, nausea and vomiting.  Genitourinary: Negative.  Negative for dysuria, frequency, hematuria and urgency.  Musculoskeletal: Negative.  Negative for back pain, falls, joint pain, myalgias and neck pain.  Skin: Negative.  Negative for itching and rash.  Neurological: Positive for headaches. Negative for dizziness, tingling, sensory change, speech change, focal weakness and weakness.       Restless legs.  Endo/Heme/Allergies: Negative.  Does not bruise/bleed easily.  Psychiatric/Behavioral: Negative for depression and  memory loss. The patient is nervous/anxious (about infusion). The patient does not have insomnia.    Performance status (ECOG):  1  Vitals Blood pressure 127/69, pulse (!) 116, temperature 97.9 F (36.6 C), temperature source Tympanic, resp. rate 20, height 5\' 4"  (1.626 m), weight 207 lb 10.8 oz (94.2 kg), last menstrual period 05/06/2018, SpO2 100 %.   Physical Exam  Constitutional: She is oriented to person, place, and time. She appears well-developed and well-nourished. No distress.  HENT:  Head: Normocephalic and atraumatic.  Dark hair.  Mask.  Eyes: Conjunctivae and EOM are normal. No scleral icterus.  Brown eyes.  Cardiovascular: Regular rhythm and normal heart sounds. Exam reveals no gallop and no friction rub.  No murmur heard. Pulmonary/Chest: Effort normal and breath sounds normal. No respiratory distress. She has no wheezes. She has no rales.  Abdominal:  Pregnant.  Musculoskeletal:        General: No edema.  Neurological: She is alert and oriented to person, place, and time.  Skin: Skin is warm. No rash noted. She is not diaphoretic. No erythema. No pallor.  Psychiatric: She has a normal mood and affect. Her behavior is normal. Judgment and thought content normal.  Nursing note and vitals reviewed.   Appointment on 01/07/2019  Component Date Value Ref Range Status  . WBC 01/07/2019 11.2* 4.0 - 10.5 K/uL Final  . RBC 01/07/2019 3.60* 3.87 - 5.11 MIL/uL Final  . Hemoglobin 01/07/2019 7.3* 12.0 - 15.0 g/dL Final  . HCT 01/07/2019 24.7* 36.0 - 46.0 % Final  . MCV 01/07/2019 68.6* 80.0 - 100.0 fL Final  . MCH 01/07/2019 20.3* 26.0 - 34.0 pg Final  . MCHC 01/07/2019 29.6* 30.0 - 36.0 g/dL Final  . RDW 01/07/2019 18.1* 11.5 - 15.5 % Final  . Platelets 01/07/2019 264  150 - 400 K/uL Final  . nRBC 01/07/2019 0.4* 0.0 - 0.2 % Final  . Neutrophils Relative % 01/07/2019 78  % Final  . Neutro Abs 01/07/2019 8.7* 1.7 - 7.7 K/uL Final  . Lymphocytes Relative 01/07/2019 14  %  Final  . Lymphs Abs 01/07/2019 1.6  0.7 - 4.0 K/uL Final  . Monocytes Relative 01/07/2019 7  % Final  . Monocytes Absolute 01/07/2019 0.8  0.1 - 1.0 K/uL Final  . Eosinophils Relative 01/07/2019 0  % Final  . Eosinophils Absolute 01/07/2019 0.0  0.0 - 0.5 K/uL Final  . Basophils Relative 01/07/2019 0  % Final  . Basophils Absolute 01/07/2019 0.0  0.0 - 0.1 K/uL Final  . Immature Granulocytes 01/07/2019 1  % Final  . Abs Immature Granulocytes 01/07/2019 0.11* 0.00 - 0.07 K/uL Final   Performed at [facility], [address] [place], [place] [address]    Assessment:  BARBARAJO JAGGER is a 38 y.o. female currently [redacted] weeks pregnant with iron deficiency anemia.  Diet is good.  She has ice pica.  She denies any bleeding.  She was taking a MVI without iron.  She is on oral iron.  Labs on 12/08/2018 revealed  a hematocrit 28.9, hemoglobin 8.7, MCV 75.7, platelets 332,000, WBC 11,200 and ANC 8760.  Work-up on 12/31/2018 revealed a hematocrit 23.9, hemoglobin 7.2, MCV 69.9, platelets 283,000, and WBC 9100 with an [place] 6700. Ferritin was 4 with an iron saturation of 3% and a TIBC of 667.  Creatinine and LFTs were normal.  Albumin was 2.9. Retic was 1.5%.  Symptomatically, she feel "fine".  Exam is unremarkable.  Plan: 1.   Labs today:  CBC, retic. 2.   Iron deficiency anemia             Hematocrit 24.7.  Hemoglobin 7.3.  MCV 68.6 today.  Review labs from last week.  Discuss tolerance of oral iron.             Discuss patient's thoughts about IV iron.                         Discuss potential side effects.   Patient wishes to pursue.    Consent obtained. 3.   Hypoalbuminemia  Albumen 2.9 on 12/31/2018.  Information provided patient's OB. 4.   Venofer today and weekly x 3 (total 4). 5.   Please draw CBC in 2 weeks. 6.   RTC prn after delivery.  I discussed the assessment and treatment plan with the patient.  The patient was provided an opportunity to ask questions and all  were answered.  The patient agreed with the plan and demonstrated an understanding of the instructions.  The patient was advised to call back if the symptoms worsen or if the condition fails to improve as anticipated.   Lequita Asal, MD, PhD    01/07/2019, 1:43 PM  I, Samul Dada, am acting as a scribe for Lequita Asal, MD.  I, [place] Mike Gip, MD, have reviewed the above documentation for accuracy and completeness, and I agree with the above.

## 2019-01-07 ENCOUNTER — Inpatient Hospital Stay (HOSPITAL_BASED_OUTPATIENT_CLINIC_OR_DEPARTMENT_OTHER): Payer: Managed Care, Other (non HMO) | Admitting: Hematology and Oncology

## 2019-01-07 ENCOUNTER — Inpatient Hospital Stay: Payer: Managed Care, Other (non HMO)

## 2019-01-07 ENCOUNTER — Other Ambulatory Visit: Payer: Self-pay

## 2019-01-07 ENCOUNTER — Encounter: Payer: Self-pay | Admitting: Hematology and Oncology

## 2019-01-07 ENCOUNTER — Encounter: Payer: Self-pay | Admitting: Dietician

## 2019-01-07 ENCOUNTER — Ambulatory Visit: Payer: Managed Care, Other (non HMO) | Admitting: Dietician

## 2019-01-07 ENCOUNTER — Telehealth: Payer: Self-pay

## 2019-01-07 ENCOUNTER — Telehealth: Payer: Self-pay | Admitting: Dietician

## 2019-01-07 VITALS — BP 113/68 | HR 98 | Temp 98.6°F | Resp 18

## 2019-01-07 VITALS — BP 127/69 | HR 116 | Temp 97.9°F | Resp 20 | Ht 64.0 in | Wt 207.7 lb

## 2019-01-07 DIAGNOSIS — E611 Iron deficiency: Secondary | ICD-10-CM | POA: Diagnosis not present

## 2019-01-07 DIAGNOSIS — D509 Iron deficiency anemia, unspecified: Secondary | ICD-10-CM

## 2019-01-07 DIAGNOSIS — O99013 Anemia complicating pregnancy, third trimester: Secondary | ICD-10-CM | POA: Diagnosis not present

## 2019-01-07 LAB — RETICULOCYTES
Immature Retic Fract: 39.1 % — ABNORMAL HIGH (ref 2.3–15.9)
RBC.: 3.61 MIL/uL — ABNORMAL LOW (ref 3.87–5.11)
Retic Count, Absolute: 63.2 10*3/uL (ref 19.0–186.0)
Retic Ct Pct: 1.8 % (ref 0.4–3.1)

## 2019-01-07 LAB — CBC WITH DIFFERENTIAL/PLATELET
Abs Immature Granulocytes: 0.11 10*3/uL — ABNORMAL HIGH (ref 0.00–0.07)
Basophils Absolute: 0 10*3/uL (ref 0.0–0.1)
Basophils Relative: 0 %
Eosinophils Absolute: 0 10*3/uL (ref 0.0–0.5)
Eosinophils Relative: 0 %
HCT: 24.7 % — ABNORMAL LOW (ref 36.0–46.0)
Hemoglobin: 7.3 g/dL — ABNORMAL LOW (ref 12.0–15.0)
Immature Granulocytes: 1 %
Lymphocytes Relative: 14 %
Lymphs Abs: 1.6 10*3/uL (ref 0.7–4.0)
MCH: 20.3 pg — ABNORMAL LOW (ref 26.0–34.0)
MCHC: 29.6 g/dL — ABNORMAL LOW (ref 30.0–36.0)
MCV: 68.6 fL — ABNORMAL LOW (ref 80.0–100.0)
Monocytes Absolute: 0.8 10*3/uL (ref 0.1–1.0)
Monocytes Relative: 7 %
Neutro Abs: 8.7 10*3/uL — ABNORMAL HIGH (ref 1.7–7.7)
Neutrophils Relative %: 78 %
Platelets: 264 10*3/uL (ref 150–400)
RBC: 3.6 MIL/uL — ABNORMAL LOW (ref 3.87–5.11)
RDW: 18.1 % — ABNORMAL HIGH (ref 11.5–15.5)
WBC: 11.2 10*3/uL — ABNORMAL HIGH (ref 4.0–10.5)
nRBC: 0.4 % — ABNORMAL HIGH (ref 0.0–0.2)

## 2019-01-07 MED ORDER — IRON SUCROSE 20 MG/ML IV SOLN
200.0000 mg | Freq: Once | INTRAVENOUS | Status: AC
  Administered 2019-01-07: 200 mg via INTRAVENOUS

## 2019-01-07 MED ORDER — SODIUM CHLORIDE 0.9 % IV SOLN: 200.0000 mg | Freq: Once | INTRAVENOUS | Status: DC

## 2019-01-07 MED ORDER — SODIUM CHLORIDE 0.9 % IV SOLN
Freq: Once | INTRAVENOUS | Status: AC
  Administered 2019-01-07: 14:00:00 via INTRAVENOUS
  Filled 2019-01-07: qty 250

## 2019-01-07 NOTE — Telephone Encounter (Signed)
Spoke with Jacqlyn Larsen at Dr Leafy Ro office to inform them that the patient Album was low with visit today. Ms Jacqlyn Larsen spoke with Midwife Gaye Pollack and she states there is nothing to do at this time. I have informed Dr Mike Gip

## 2019-01-07 NOTE — Patient Instructions (Signed)

## 2019-01-07 NOTE — Telephone Encounter (Signed)
Patient returned phone call; she reports BGs are in good control thus far. She has been in informal consult with dietitian at her workplace, so does not need to return for an RD visit at this office. Will send notification to referring provider.

## 2019-01-07 NOTE — Progress Notes (Signed)
No new changes noted today 

## 2019-01-07 NOTE — Progress Notes (Signed)
Sent discharge notification to referring provider.

## 2019-01-07 NOTE — Telephone Encounter (Signed)
Called patient to reschedule her appointment from today, which she cancelled. Unable to leave a message on home number due to full mailbox. Attempted cell number, no voicemail set up.

## 2019-01-14 ENCOUNTER — Inpatient Hospital Stay: Payer: Managed Care, Other (non HMO)

## 2019-01-14 ENCOUNTER — Other Ambulatory Visit: Payer: Self-pay

## 2019-01-14 VITALS — BP 116/71 | HR 97 | Temp 98.7°F | Resp 17

## 2019-01-14 DIAGNOSIS — D509 Iron deficiency anemia, unspecified: Secondary | ICD-10-CM

## 2019-01-14 DIAGNOSIS — O99013 Anemia complicating pregnancy, third trimester: Secondary | ICD-10-CM | POA: Diagnosis not present

## 2019-01-14 LAB — OB RESULTS CONSOLE GC/CHLAMYDIA
Chlamydia: NEGATIVE
Gonorrhea: NEGATIVE

## 2019-01-14 LAB — OB RESULTS CONSOLE RPR: RPR: NONREACTIVE

## 2019-01-14 LAB — OB RESULTS CONSOLE HIV ANTIBODY (ROUTINE TESTING): HIV: NONREACTIVE

## 2019-01-14 LAB — OB RESULTS CONSOLE GBS: GBS: NEGATIVE

## 2019-01-14 MED ORDER — IRON SUCROSE 20 MG/ML IV SOLN
200.0000 mg | Freq: Once | INTRAVENOUS | Status: AC
  Administered 2019-01-14: 200 mg via INTRAVENOUS

## 2019-01-14 MED ORDER — SODIUM CHLORIDE 0.9 % IV SOLN
Freq: Once | INTRAVENOUS | Status: AC
  Administered 2019-01-14: 12:00:00 via INTRAVENOUS
  Filled 2019-01-14: qty 250

## 2019-01-14 MED ORDER — SODIUM CHLORIDE 0.9 % IV SOLN: 200.0000 mg | Freq: Once | INTRAVENOUS | Status: DC

## 2019-01-14 NOTE — Patient Instructions (Signed)

## 2019-01-21 ENCOUNTER — Other Ambulatory Visit: Payer: Self-pay

## 2019-01-21 ENCOUNTER — Inpatient Hospital Stay: Payer: Managed Care, Other (non HMO)

## 2019-01-21 VITALS — BP 134/78 | HR 88 | Temp 97.1°F | Resp 16

## 2019-01-21 DIAGNOSIS — E611 Iron deficiency: Secondary | ICD-10-CM

## 2019-01-21 DIAGNOSIS — D509 Iron deficiency anemia, unspecified: Secondary | ICD-10-CM

## 2019-01-21 DIAGNOSIS — O99013 Anemia complicating pregnancy, third trimester: Secondary | ICD-10-CM | POA: Diagnosis not present

## 2019-01-21 LAB — CBC
HCT: 27.7 % — ABNORMAL LOW (ref 36.0–46.0)
Hemoglobin: 8 g/dL — ABNORMAL LOW (ref 12.0–15.0)
MCH: 20.6 pg — ABNORMAL LOW (ref 26.0–34.0)
MCHC: 28.9 g/dL — ABNORMAL LOW (ref 30.0–36.0)
MCV: 71.4 fL — ABNORMAL LOW (ref 80.0–100.0)
Platelets: 251 10*3/uL (ref 150–400)
RBC: 3.88 MIL/uL (ref 3.87–5.11)
RDW: 23.8 % — ABNORMAL HIGH (ref 11.5–15.5)
WBC: 7.9 10*3/uL (ref 4.0–10.5)
nRBC: 0.3 % — ABNORMAL HIGH (ref 0.0–0.2)

## 2019-01-21 MED ORDER — SODIUM CHLORIDE 0.9 % IV SOLN
Freq: Once | INTRAVENOUS | Status: AC
  Administered 2019-01-21: 10:00:00 via INTRAVENOUS
  Filled 2019-01-21: qty 250

## 2019-01-21 MED ORDER — IRON SUCROSE 20 MG/ML IV SOLN
200.0000 mg | Freq: Once | INTRAVENOUS | Status: AC
  Administered 2019-01-21: 200 mg via INTRAVENOUS

## 2019-01-21 MED ORDER — SODIUM CHLORIDE 0.9 % IV SOLN: 200.0000 mg | Freq: Once | INTRAVENOUS | Status: DC

## 2019-01-21 NOTE — Patient Instructions (Signed)

## 2019-01-21 NOTE — H&P (Signed)
Elizabeth Bryan is a 38 y.o. female presenting for elective repeat LTCS and BTL . EDC 02/10/2019  Anemia undergoing Iron transfusions. hgb 8.0 on 01/21/2019 CHTN - no meds  OB History    Gravida  1   Para      Term      Preterm      AB      Living        SAB      TAB      Ectopic      Multiple      Live Births             Past Medical History:  Diagnosis Date  . Anemia   . Gestational diabetes   . Ovarian cyst    Past Surgical History:  Procedure Laterality Date  . CESAREAN SECTION    . DILATION AND EVACUATION N/A 05/14/2015   Procedure: DILATATION AND EVACUATION;  Surgeon: Gae Dry, MD;  Location: [facility] ORS;  Service: Gynecology;  Laterality: N/A;   Family History: family history includes Diabetes in her maternal aunt, maternal grandmother, and mother. Social History:  reports that she quit smoking about 7 months ago. Her smoking use included cigarettes and cigars. She has a 16.00 pack-year smoking history. She has never used smokeless tobacco. She reports previous alcohol use. No history on file for drug.     Maternal Diabetes: Yes:  Diabetes Type:  Diet controlled Genetic Screening: Normal Maternal Ultrasounds/Referrals: Normal Fetal Ultrasounds or other Referrals:  None Maternal Substance Abuse:  No Significant Maternal Medications:  None Significant Maternal Lab Results:  Group B Strep negative Other Comments:  None  ROS History   Last menstrual period 05/06/2018. Exam Physical Exam   lungs : cta   Cv RRR  abd gravid   Prenatal labs: ABO, Rh:  O+ Antibody:  neg  Rubella:  Imm/ Vz  IMM RPR:   NR  HBsAg:   neg HIV:   neg GBS:   NEG  Assessment/Plan: Elective repeat LTCS and BTL  02/03/2019 39+0 weeks     Elizabeth Bryan 01/21/2019, 6:01 PM

## 2019-01-27 ENCOUNTER — Other Ambulatory Visit
Admission: RE | Admit: 2019-01-27 | Discharge: 2019-01-27 | Disposition: A | Discharge status: Home / Self Care | Payer: Managed Care, Other (non HMO) | Source: Ambulatory Visit | Attending: Obstetrics and Gynecology | Admitting: Obstetrics and Gynecology

## 2019-01-27 ENCOUNTER — Other Ambulatory Visit: Payer: Self-pay

## 2019-01-27 HISTORY — DX: Unspecified asthma, uncomplicated: J45.909

## 2019-01-27 HISTORY — DX: Essential (primary) hypertension: I10

## 2019-01-27 NOTE — Patient Instructions (Signed)
Your procedure is scheduled on: Thurs 11/5  Report to [facility] mall entrance     Call [phone] and you will be escorted to L&D.   Remember: Instructions that are not followed completely may result in serious medical risk,  up to and including death, or upon the discretion of your surgeon and anesthesiologist your  surgery may need to be rescheduled.     _X__ 1. Do not eat food after midnight the night before your procedure.                 No gum chewing or hard candies. You may drink clear liquids up to 2 hours                 before you are scheduled to arrive for your surgery- DO not drink clear                 liquids within 2 hours of the start of your surgery.                 Clear Liquids include:  water, apple juice without pulp, clear carbohydrate                 drink such as Clearfast of Gatorade, Black Coffee or Tea (Do not add                 anything to coffee or tea).  __X__2.  On the morning of surgery brush your teeth with toothpaste and water, you                may rinse your mouth with mouthwash if you wish.  Do not swallow any toothpaste of mouthwash.     ___ 3.  No Alcohol for 24 hours before or after surgery.   ___ 4.  Do Not Smoke or use e-cigarettes For 24 Hours Prior to Your Surgery.                 Do not use any chewable tobacco products for at least 6 hours prior to                 surgery.  ____  5.  Bring all medications with you on the day of surgery if instructed.   __x__  6.  Notify your doctor if there is any change in your medical condition      (cold, fever, infections).     Do not wear jewelry, make-up, hairpins, clips or nail polish. Do not wear lotions, powders, or perfumes. You may wear deodorant. Do not shave 48 hours prior to surgery. Men may shave face and neck. Do not bring valuables to the hospital.    [facility] is not responsible for any belongings or valuables.  Contacts, dentures or bridgework may not be  worn into surgery. Leave your suitcase in the car. After surgery it may be brought to your room. For patients admitted to the hospital, discharge time is determined by your treatment team.   Patients discharged the day of surgery will not be allowed to drive home.   Please read over the following fact sheets that you were given:    _x___ Take these medicines the morning of surgery with A SIP OF WATER:    1. none  2.   3.   4.  5.  6.  ____ Fleet Enema (as directed)   _x___ Use CHG Soap as directed  ____ Use inhalers on the day of surgery  ____ Stop metformin 2 days prior to surgery    ____ Take 1/2 of usual insulin dose the night before surgery. No insulin the morning          of surgery.   ____ Stop Coumadin/Plavix/aspirin on   ____ Stop Anti-inflammatories on    ____ Stop supplements until after surgery.    ____ Bring C-Pap to the hospital.

## 2019-01-28 ENCOUNTER — Other Ambulatory Visit: Payer: Self-pay

## 2019-01-28 ENCOUNTER — Inpatient Hospital Stay: Payer: Managed Care, Other (non HMO)

## 2019-01-28 VITALS — BP 133/78 | HR 94 | Temp 98.5°F | Resp 16

## 2019-01-28 DIAGNOSIS — D509 Iron deficiency anemia, unspecified: Secondary | ICD-10-CM

## 2019-01-28 DIAGNOSIS — O99013 Anemia complicating pregnancy, third trimester: Secondary | ICD-10-CM | POA: Diagnosis not present

## 2019-01-28 MED ORDER — IRON SUCROSE 20 MG/ML IV SOLN
200.0000 mg | Freq: Once | INTRAVENOUS | Status: AC
  Administered 2019-01-28: 200 mg via INTRAVENOUS
  Filled 2019-01-28: qty 10

## 2019-01-28 MED ORDER — SODIUM CHLORIDE 0.9 % IV SOLN: 200.0000 mg | Freq: Once | INTRAVENOUS | Status: DC

## 2019-01-28 MED ORDER — SODIUM CHLORIDE 0.9 % IV SOLN
Freq: Once | INTRAVENOUS | Status: AC
  Administered 2019-01-28: 14:00:00 via INTRAVENOUS
  Filled 2019-01-28: qty 250

## 2019-01-28 NOTE — Patient Instructions (Signed)

## 2019-01-31 ENCOUNTER — Other Ambulatory Visit
Admission: RE | Admit: 2019-01-31 | Discharge: 2019-01-31 | Disposition: A | Discharge status: Home / Self Care | Payer: Managed Care, Other (non HMO) | Source: Ambulatory Visit | Attending: Obstetrics and Gynecology | Admitting: Obstetrics and Gynecology

## 2019-01-31 ENCOUNTER — Other Ambulatory Visit: Payer: Self-pay

## 2019-01-31 DIAGNOSIS — Z20828 Contact with and (suspected) exposure to other viral communicable diseases: Secondary | ICD-10-CM | POA: Insufficient documentation

## 2019-01-31 DIAGNOSIS — Z01812 Encounter for preprocedural laboratory examination: Secondary | ICD-10-CM | POA: Insufficient documentation

## 2019-01-31 LAB — SARS CORONAVIRUS 2 (TAT 6-24 HRS): SARS Coronavirus 2: NEGATIVE

## 2019-02-03 ENCOUNTER — Inpatient Hospital Stay: Payer: Managed Care, Other (non HMO) | Admitting: Anesthesiology

## 2019-02-03 ENCOUNTER — Inpatient Hospital Stay
Admission: RE | Admit: 2019-02-03 | Discharge: 2019-02-05 | DRG: 784 | Disposition: A | Discharge status: Home / Self Care | Payer: Managed Care, Other (non HMO) | Attending: Obstetrics and Gynecology | Admitting: Obstetrics and Gynecology

## 2019-02-03 ENCOUNTER — Other Ambulatory Visit: Payer: Self-pay

## 2019-02-03 ENCOUNTER — Encounter
Admission: RE | Disposition: A | Discharge status: Home / Self Care | Payer: Self-pay | Source: Home / Self Care | Attending: Obstetrics and Gynecology

## 2019-02-03 DIAGNOSIS — Z302 Encounter for sterilization: Secondary | ICD-10-CM | POA: Diagnosis not present

## 2019-02-03 DIAGNOSIS — O34211 Maternal care for low transverse scar from previous cesarean delivery: Principal | ICD-10-CM | POA: Diagnosis present

## 2019-02-03 DIAGNOSIS — O1002 Pre-existing essential hypertension complicating childbirth: Secondary | ICD-10-CM | POA: Diagnosis present

## 2019-02-03 DIAGNOSIS — Z20828 Contact with and (suspected) exposure to other viral communicable diseases: Secondary | ICD-10-CM | POA: Diagnosis present

## 2019-02-03 DIAGNOSIS — Z9889 Other specified postprocedural states: Secondary | ICD-10-CM

## 2019-02-03 DIAGNOSIS — Z3A39 39 weeks gestation of pregnancy: Secondary | ICD-10-CM | POA: Diagnosis not present

## 2019-02-03 DIAGNOSIS — D259 Leiomyoma of uterus, unspecified: Secondary | ICD-10-CM | POA: Diagnosis present

## 2019-02-03 DIAGNOSIS — N736 Female pelvic peritoneal adhesions (postinfective): Secondary | ICD-10-CM | POA: Diagnosis present

## 2019-02-03 DIAGNOSIS — O99892 Other specified diseases and conditions complicating childbirth: Secondary | ICD-10-CM | POA: Diagnosis present

## 2019-02-03 DIAGNOSIS — D62 Acute posthemorrhagic anemia: Secondary | ICD-10-CM | POA: Diagnosis not present

## 2019-02-03 DIAGNOSIS — Z87891 Personal history of nicotine dependence: Secondary | ICD-10-CM

## 2019-02-03 DIAGNOSIS — O9081 Anemia of the puerperium: Secondary | ICD-10-CM | POA: Diagnosis not present

## 2019-02-03 DIAGNOSIS — O3413 Maternal care for benign tumor of corpus uteri, third trimester: Secondary | ICD-10-CM | POA: Diagnosis present

## 2019-02-03 DIAGNOSIS — O34219 Maternal care for unspecified type scar from previous cesarean delivery: Secondary | ICD-10-CM | POA: Diagnosis present

## 2019-02-03 DIAGNOSIS — O24410 Gestational diabetes mellitus in pregnancy, diet controlled: Secondary | ICD-10-CM | POA: Diagnosis not present

## 2019-02-03 LAB — BASIC METABOLIC PANEL
Anion gap: 9 (ref 5–15)
BUN: <5 mg/dL — ABNORMAL LOW (ref 6–20)
CO2: 21 mmol/L — ABNORMAL LOW (ref 22–32)
Calcium: 8.9 mg/dL (ref 8.9–10.3)
Chloride: 107 mmol/L (ref 98–111)
Creatinine, Ser: 0.66 mg/dL (ref 0.44–1.00)
GFR calc Af Amer: >60 mL/min (ref >60)
GFR calc non Af Amer: >60 mL/min (ref >60)
Glucose, Bld: 97 mg/dL (ref 70–99)
Potassium: 3.4 mmol/L — ABNORMAL LOW (ref 3.5–5.1)
Sodium: 137 mmol/L (ref 135–145)

## 2019-02-03 LAB — CBC
HCT: 30.6 % — ABNORMAL LOW (ref 36.0–46.0)
Hemoglobin: 9 g/dL — ABNORMAL LOW (ref 12.0–15.0)
MCH: 22.1 pg — ABNORMAL LOW (ref 26.0–34.0)
MCHC: 29.4 g/dL — ABNORMAL LOW (ref 30.0–36.0)
MCV: 75 fL — ABNORMAL LOW (ref 80.0–100.0)
Platelets: 202 10*3/uL (ref 150–400)
RBC: 4.08 MIL/uL (ref 3.87–5.11)
RDW: 28.3 % — ABNORMAL HIGH (ref 11.5–15.5)
WBC: 8 10*3/uL (ref 4.0–10.5)
nRBC: 0.3 % — ABNORMAL HIGH (ref 0.0–0.2)

## 2019-02-03 SURGERY — Surgical Case
Anesthesia: General

## 2019-02-03 MED ORDER — SENNOSIDES-DOCUSATE SODIUM 8.6-50 MG PO TABS
2.0000 | ORAL_TABLET | ORAL | Status: DC
  Administered 2019-02-04 – 2019-02-05 (×2): 2 via ORAL
  Filled 2019-02-03 (×2): qty 2

## 2019-02-03 MED ORDER — ACETAMINOPHEN 500 MG PO TABS
1000.0000 mg | ORAL_TABLET | Freq: Four times a day (QID) | ORAL | Status: AC
  Administered 2019-02-03 – 2019-02-04 (×4): 1000 mg via ORAL
  Filled 2019-02-03 (×4): qty 2

## 2019-02-03 MED ORDER — NALBUPHINE HCL 10 MG/ML IJ SOLN: 5.0000 mg | Freq: Once | INTRAMUSCULAR | Status: DC | PRN

## 2019-02-03 MED ORDER — OXYTOCIN 40 UNITS IN NORMAL SALINE INFUSION - SIMPLE MED
INTRAVENOUS | Status: AC
  Filled 2019-02-03: qty 1000

## 2019-02-03 MED ORDER — KETOROLAC TROMETHAMINE 30 MG/ML IJ SOLN
30.0000 mg | Freq: Four times a day (QID) | INTRAMUSCULAR | Status: DC | PRN
  Filled 2019-02-03 (×2): qty 1

## 2019-02-03 MED ORDER — COCONUT OIL OIL
1.0000 "application " | TOPICAL_OIL | Status: DC | PRN
  Administered 2019-02-03: 1 via TOPICAL
  Filled 2019-02-03: qty 120

## 2019-02-03 MED ORDER — PHENYLEPHRINE 40 MCG/ML (10ML) SYRINGE FOR IV PUSH (FOR BLOOD PRESSURE SUPPORT)
PREFILLED_SYRINGE | INTRAVENOUS | Status: DC | PRN
  Administered 2019-02-03 (×2): 100 ug via INTRAVENOUS

## 2019-02-03 MED ORDER — ENOXAPARIN SODIUM 40 MG/0.4ML ~~LOC~~ SOLN
40.0000 mg | SUBCUTANEOUS | Status: DC
  Administered 2019-02-04 – 2019-02-05 (×2): 40 mg via SUBCUTANEOUS
  Filled 2019-02-03 (×2): qty 0.4

## 2019-02-03 MED ORDER — BUPIVACAINE IN DEXTROSE 0.75-8.25 % IT SOLN
INTRATHECAL | Status: DC | PRN
  Administered 2019-02-03: 1.6 mL via INTRATHECAL

## 2019-02-03 MED ORDER — ACETAMINOPHEN 10 MG/ML IV SOLN
INTRAVENOUS | Status: DC | PRN
  Administered 2019-02-03: 1000 mg via INTRAVENOUS

## 2019-02-03 MED ORDER — BUPIVACAINE HCL (PF) 0.5 % IJ SOLN
INTRAMUSCULAR | Status: AC
  Filled 2019-02-03: qty 30

## 2019-02-03 MED ORDER — SIMETHICONE 80 MG PO CHEW
80.0000 mg | CHEWABLE_TABLET | Freq: Three times a day (TID) | ORAL | Status: DC
  Administered 2019-02-03 – 2019-02-05 (×6): 80 mg via ORAL
  Filled 2019-02-03 (×6): qty 1

## 2019-02-03 MED ORDER — LACTATED RINGERS IV SOLN
INTRAVENOUS | Status: DC
  Administered 2019-02-03 (×2): via INTRAVENOUS

## 2019-02-03 MED ORDER — NALBUPHINE HCL 10 MG/ML IJ SOLN: 5.0000 mg | INTRAMUSCULAR | Status: DC | PRN

## 2019-02-03 MED ORDER — BUPIVACAINE HCL (PF) 0.5 % IJ SOLN
INTRAMUSCULAR | Status: DC | PRN
  Administered 2019-02-03: 30 mL

## 2019-02-03 MED ORDER — SODIUM CHLORIDE (PF) 0.9 % IJ SOLN
INTRAMUSCULAR | Status: AC
  Filled 2019-02-03: qty 50

## 2019-02-03 MED ORDER — OXYTOCIN 40 UNITS IN NORMAL SALINE INFUSION - SIMPLE MED
INTRAVENOUS | Status: DC | PRN
  Administered 2019-02-03: 500 mL via INTRAVENOUS
  Administered 2019-02-03: 1 mL via INTRAVENOUS

## 2019-02-03 MED ORDER — DIBUCAINE (PERIANAL) 1 % EX OINT: 1.0000 "application " | TOPICAL_OINTMENT | CUTANEOUS | Status: DC | PRN

## 2019-02-03 MED ORDER — ACETAMINOPHEN 10 MG/ML IV SOLN
INTRAVENOUS | Status: AC
  Filled 2019-02-03: qty 100

## 2019-02-03 MED ORDER — SIMETHICONE 80 MG PO CHEW: 80.0000 mg | CHEWABLE_TABLET | ORAL | Status: DC | PRN

## 2019-02-03 MED ORDER — GABAPENTIN 600 MG PO TABS
300.0000 mg | ORAL_TABLET | Freq: Once | ORAL | Status: AC
  Administered 2019-02-03: 300 mg via ORAL
  Filled 2019-02-03: qty 0.5

## 2019-02-03 MED ORDER — ZOLPIDEM TARTRATE 5 MG PO TABS: 5.0000 mg | ORAL_TABLET | Freq: Every evening | ORAL | Status: DC | PRN

## 2019-02-03 MED ORDER — BUPIVACAINE LIPOSOME 1.3 % IJ SUSP
INTRAMUSCULAR | Status: DC | PRN
  Administered 2019-02-03: 20 mL

## 2019-02-03 MED ORDER — ACETAMINOPHEN 500 MG PO TABS: 1000.0000 mg | ORAL_TABLET | Freq: Four times a day (QID) | ORAL | Status: DC

## 2019-02-03 MED ORDER — PHENYLEPHRINE HCL (PRESSORS) 10 MG/ML IV SOLN
INTRAVENOUS | Status: DC | PRN
  Administered 2019-02-03 (×2): 100 ug via INTRAVENOUS

## 2019-02-03 MED ORDER — DIPHENHYDRAMINE HCL 25 MG PO CAPS: 25.0000 mg | ORAL_CAPSULE | ORAL | Status: DC | PRN

## 2019-02-03 MED ORDER — FENTANYL CITRATE (PF) 100 MCG/2ML IJ SOLN
INTRAMUSCULAR | Status: DC | PRN
  Administered 2019-02-03: 15 ug via INTRAVENOUS

## 2019-02-03 MED ORDER — MENTHOL 3 MG MT LOZG
1.0000 | LOZENGE | OROMUCOSAL | Status: DC | PRN
  Filled 2019-02-03: qty 9

## 2019-02-03 MED ORDER — GABAPENTIN 300 MG PO CAPS
300.0000 mg | ORAL_CAPSULE | Freq: Every day | ORAL | Status: DC
  Administered 2019-02-03 – 2019-02-04 (×2): 300 mg via ORAL
  Filled 2019-02-03 (×2): qty 1

## 2019-02-03 MED ORDER — DIPHENHYDRAMINE HCL 25 MG PO CAPS: 25.0000 mg | ORAL_CAPSULE | Freq: Four times a day (QID) | ORAL | Status: DC | PRN

## 2019-02-03 MED ORDER — ONDANSETRON HCL 4 MG/2ML IJ SOLN: 4.0000 mg | Freq: Three times a day (TID) | INTRAMUSCULAR | Status: DC | PRN

## 2019-02-03 MED ORDER — OXYTOCIN 40 UNITS IN NORMAL SALINE INFUSION - SIMPLE MED
2.5000 [IU]/h | INTRAVENOUS | Status: AC
  Administered 2019-02-03: 2.5 [IU]/h via INTRAVENOUS

## 2019-02-03 MED ORDER — TETANUS-DIPHTH-ACELL PERTUSSIS 5-2.5-18.5 LF-MCG/0.5 IM SUSP: 0.5000 mL | Freq: Once | INTRAMUSCULAR | Status: DC

## 2019-02-03 MED ORDER — BUPIVACAINE LIPOSOME 1.3 % IJ SUSP
INTRAMUSCULAR | Status: AC
  Filled 2019-02-03: qty 20

## 2019-02-03 MED ORDER — NALOXONE HCL 4 MG/10ML IJ SOLN
1.0000 ug/kg/h | INTRAVENOUS | Status: DC | PRN
  Filled 2019-02-03: qty 5

## 2019-02-03 MED ORDER — SIMETHICONE 80 MG PO CHEW
80.0000 mg | CHEWABLE_TABLET | ORAL | Status: DC
  Administered 2019-02-03 – 2019-02-04 (×2): 80 mg via ORAL
  Filled 2019-02-03 (×2): qty 1

## 2019-02-03 MED ORDER — WITCH HAZEL-GLYCERIN EX PADS: 1.0000 "application " | MEDICATED_PAD | CUTANEOUS | Status: DC | PRN

## 2019-02-03 MED ORDER — OXYCODONE HCL 5 MG PO TABS
5.0000 mg | ORAL_TABLET | ORAL | Status: DC | PRN
  Administered 2019-02-04 – 2019-02-05 (×2): 5 mg via ORAL
  Filled 2019-02-03 (×2): qty 1

## 2019-02-03 MED ORDER — NALOXONE HCL 0.4 MG/ML IJ SOLN: 0.4000 mg | INTRAMUSCULAR | Status: DC | PRN

## 2019-02-03 MED ORDER — FENTANYL CITRATE (PF) 100 MCG/2ML IJ SOLN
INTRAMUSCULAR | Status: AC
  Filled 2019-02-03: qty 2

## 2019-02-03 MED ORDER — SODIUM CHLORIDE 0.9% FLUSH: 3.0000 mL | INTRAVENOUS | Status: DC | PRN

## 2019-02-03 MED ORDER — ONDANSETRON HCL 4 MG/2ML IJ SOLN
INTRAMUSCULAR | Status: DC | PRN
  Administered 2019-02-03: 4 mg via INTRAVENOUS

## 2019-02-03 MED ORDER — SODIUM CHLORIDE 0.9% FLUSH
INTRAVENOUS | Status: DC | PRN
  Administered 2019-02-03: 50 mL

## 2019-02-03 MED ORDER — LACTATED RINGERS IV SOLN
INTRAVENOUS | Status: DC
  Administered 2019-02-04: 10:00:00 via INTRAVENOUS

## 2019-02-03 MED ORDER — KETOROLAC TROMETHAMINE 30 MG/ML IJ SOLN
30.0000 mg | Freq: Four times a day (QID) | INTRAMUSCULAR | Status: DC | PRN
  Administered 2019-02-03 – 2019-02-04 (×4): 30 mg via INTRAVENOUS
  Filled 2019-02-03 (×2): qty 1

## 2019-02-03 MED ORDER — DEXAMETHASONE SODIUM PHOSPHATE 10 MG/ML IJ SOLN
INTRAMUSCULAR | Status: DC | PRN
  Administered 2019-02-03: 10 mg via INTRAVENOUS

## 2019-02-03 MED ORDER — EPHEDRINE SULFATE-NACL 50-0.9 MG/10ML-% IV SOSY
PREFILLED_SYRINGE | INTRAVENOUS | Status: DC | PRN
  Administered 2019-02-03: 15 mg via INTRAVENOUS

## 2019-02-03 MED ORDER — PHENYLEPHRINE HCL-NACL 20-0.9 MG/250ML-% IV SOLN
INTRAVENOUS | Status: DC | PRN
  Administered 2019-02-03: 50 ug/min via INTRAVENOUS

## 2019-02-03 MED ORDER — BUPIVACAINE LIPOSOME 1.3 % IJ SUSP
20.0000 mL | Freq: Once | INTRAMUSCULAR | Status: DC
  Filled 2019-02-03: qty 20

## 2019-02-03 MED ORDER — MORPHINE SULFATE (PF) 0.5 MG/ML IJ SOLN
INTRAMUSCULAR | Status: DC | PRN
  Administered 2019-02-03: .1 mg via EPIDURAL

## 2019-02-03 MED ORDER — PRENATAL MULTIVITAMIN CH
1.0000 | ORAL_TABLET | Freq: Every day | ORAL | Status: DC
  Administered 2019-02-03 – 2019-02-05 (×3): 1 via ORAL
  Filled 2019-02-03 (×4): qty 1

## 2019-02-03 MED ORDER — MORPHINE SULFATE (PF) 2 MG/ML IV SOLN: 1.0000 mg | INTRAVENOUS | Status: DC | PRN

## 2019-02-03 MED ORDER — CEFAZOLIN SODIUM-DEXTROSE 2-4 GM/100ML-% IV SOLN
2.0000 g | INTRAVENOUS | Status: AC
  Administered 2019-02-03: 09:00:00 2 g via INTRAVENOUS
  Filled 2019-02-03: qty 100

## 2019-02-03 MED ORDER — DIPHENHYDRAMINE HCL 50 MG/ML IJ SOLN: 12.5000 mg | INTRAMUSCULAR | Status: DC | PRN

## 2019-02-03 MED ORDER — MORPHINE SULFATE (PF) 0.5 MG/ML IJ SOLN
INTRAMUSCULAR | Status: AC
  Filled 2019-02-03: qty 10

## 2019-02-03 MED ORDER — SOD CITRATE-CITRIC ACID 500-334 MG/5ML PO SOLN
ORAL | Status: AC
  Administered 2019-02-03: 30 mL
  Filled 2019-02-03: qty 30

## 2019-02-03 SURGICAL SUPPLY — 31 items
BARRIER ADHS 3X4 INTERCEED (GAUZE/BANDAGES/DRESSINGS) ×3 IMPLANT
CANISTER SUCT 3000ML PPV (MISCELLANEOUS) ×2 IMPLANT
CHLORAPREP W/TINT 26 (MISCELLANEOUS) ×2 IMPLANT
COVER WAND RF STERILE (DRAPES) ×2 IMPLANT
DRSG TELFA 3X8 NADH (GAUZE/BANDAGES/DRESSINGS) ×4 IMPLANT
ELECT CAUTERY BLADE 6.4 (BLADE) ×2 IMPLANT
ELECT REM PT RETURN 9FT ADLT (ELECTROSURGICAL) ×2
ELECTRODE REM PT RTRN 9FT ADLT (ELECTROSURGICAL) ×1 IMPLANT
GAUZE SPONGE 4X4 12PLY STRL (GAUZE/BANDAGES/DRESSINGS) ×3 IMPLANT
GLOVE BIO SURGEON STRL SZ8 (GLOVE) ×2 IMPLANT
GOWN STRL REUS W/ TWL LRG LVL3 (GOWN DISPOSABLE) ×2 IMPLANT
GOWN STRL REUS W/ TWL XL LVL3 (GOWN DISPOSABLE) ×1 IMPLANT
GOWN STRL REUS W/TWL LRG LVL3 (GOWN DISPOSABLE) ×2
GOWN STRL REUS W/TWL XL LVL3 (GOWN DISPOSABLE) ×1
NS IRRIG 1000ML POUR BTL (IV SOLUTION) ×2 IMPLANT
PACK C SECTION AR (MISCELLANEOUS) ×2 IMPLANT
PAD DRESSING TELFA 3X8 NADH (GAUZE/BANDAGES/DRESSINGS) ×1 IMPLANT
PAD OB MATERNITY 4.3X12.25 (PERSONAL CARE ITEMS) ×2 IMPLANT
PAD PREP 24X41 OB/GYN DISP (PERSONAL CARE ITEMS) ×2 IMPLANT
PENCIL SMOKE ULTRAEVAC 22 CON (MISCELLANEOUS) ×2 IMPLANT
RETRACTOR TRAXI PANNICULUS (MISCELLANEOUS) IMPLANT
SPONGE LAP 18X18 RF (DISPOSABLE) ×1 IMPLANT
STAPLER INSORB 30 2030 C-SECTI (MISCELLANEOUS) ×1 IMPLANT
STRAP SAFETY 5IN WIDE (MISCELLANEOUS) ×2 IMPLANT
STRIP CLOSURE SKIN 1/2X4 (GAUZE/BANDAGES/DRESSINGS) ×1 IMPLANT
SUCT VACUUM KIWI BELL (SUCTIONS) ×1 IMPLANT
SUT CHROMIC 1 CTX 36 (SUTURE) ×6 IMPLANT
SUT PLAIN GUT 0 (SUTURE) ×4 IMPLANT
SUT VIC AB 0 CT1 36 (SUTURE) ×14 IMPLANT
SUT VIC AB 2-0 CT1 36 (SUTURE) ×1 IMPLANT
TRAXI PANNICULUS RETRACTOR (MISCELLANEOUS)

## 2019-02-03 NOTE — Anesthesia Post-op Follow-up Note (Signed)
Anesthesia QCDR form completed.        

## 2019-02-03 NOTE — Anesthesia Procedure Notes (Signed)
Spinal  Patient location during procedure: OR Start time: 02/03/2019 9:00 AM End time: 02/03/2019 9:03 AM Staffing Resident/CRNA: Justus Memory, CRNA Performed: resident/CRNA  Preanesthetic Checklist Completed: patient identified, surgical consent, pre-op evaluation, timeout performed, IV checked, risks and benefits discussed and monitors and equipment checked Spinal Block Prep: ChloraPrep Patient monitoring: heart rate, cardiac monitor, continuous pulse ox and blood pressure Approach: midline Location: L3-4 Injection technique: single-shot Needle Needle type: Pencil-Tip and Whitacre  Needle gauge: 25 G Assessment Sensory level: T3

## 2019-02-03 NOTE — Op Note (Signed)
NAME: KINZA, LUCHS MEDICAL RECORD [id number] ACCOUNT [id number] DATE OF BIRTH:19-Feb-1981 FACILITY: [facility] LOCATION: [facility] PHYSICIAN:Paden Kuras Josefine Class, MD  OPERATIVE REPORT  DATE OF PROCEDURE:  02/03/2019  PREOPERATIVE DIAGNOSES: 1.  39+0 weeks estimated gestational age. 2.  Elective repeat cesarean section. 3.  Elective permanent sterilization.  POSTOPERATIVE DIAGNOSES: 1.  39+0 weeks estimated gestational age. 2.  Elective repeat cesarean section. 3.  Elective sterilization. 4.  Extensive abdominopelvic adhesions.  PROCEDURE: 1.  Repeat low transverse cesarean section. 2.  Extensive adhesiolysis. 3.  Pomeroy bilateral tubal ligation.  ANESTHESIA:  Spinal.  SURGEON:  Laverta Baltimore, MD.  FIRST ASSISTANT:  Hassan Buckler, certified nurse midwife.  SECOND ASSISTANT:  PA student Arnoldo Morale.  INDICATIONS:  A 38 year old gravida 2, para 1 patient at 65+0 weeks estimated gestational age.  The patient has elected for repeat cesarean section and permanent sterilization.  FINDINGS:  Significant omental and bowel adhesions to several aspects of the uterus densely adherent.  Normal vigorous female delivered.  DESCRIPTION OF PROCEDURE:  After adequate spinal anesthesia, the patient was placed in dorsal supine position, hip roll on the right side.  The patient was prepped and draped in normal sterile fashion.  The patient did receive 2 g of IV Ancef prior to  commencement of the case.  Timeout was performed.  A Pfannenstiel incision was made 2 fingerbreadths above the symphysis pubis.  Sharp dissection was used to identify the fascia.  Fascia was opened in the midline and opened in a transverse fashion.   Superior aspect of the fascia was grasped with Kocher clamps and recti muscles were dissected free.  Inferior aspect of the fascia was grasped with Kocher clamps.  Pyramidalis muscles dissected free.  Entry into the peritoneal cavity was accomplished  sharply.  Once  gaining entrance,   there were multiple dense adhesions of the omentum to the anterior abdominal wall and to the myometrium.  Next, 25 minutes adhesiolysis took place.  This was greater than 50% of the total operating time.  Once the  uterus was free of all adhesions, the vesicouterine peritoneal fold was identified and opened and a bladder flap was created and the bladder was reflected inferiorly.  Low transverse uterine incision was made.  Upon entry into the endometrial cavity,  clear fluid resulted.  The incision was extended with blunt transverse traction.  Fetal head was brought to the incision and a vacuum was applied to the occiput and with 1 gentle pull, the head was delivered.  Vacuum was removed and the shoulders and  body were delivered without difficulty.  A vigorous female was then dried on the patient's abdomen for 1 minute.  Time of birth was 0938 hours on 02/03/2019.  Apgars assigned 8 and 9.  Fetal weight 8 pounds 1 ounce.  After 60 seconds, cord was doubly  clamped and infant was passed to the nursery staff.  Placenta was manually delivered and the uterus was exteriorized.  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  The most prominent was mid anterior approximately 4 x 4 cm in size.  The endometrial  cavity was wiped clean with laparotomy tape and the uterine incision was closed with 1 chromic suture in a running locking fashion.  Good hemostasis was noted.  Two additional figure-of-eight sutures were required for hemostasis.  Attention was directed  to the patient's right fallopian tube, which was grasped at the midportion of the fallopian tube, and 2 separate 0 plain gut sutures were applied and a 1.5 cm portion  of fallopian tube was removed.  Good hemostasis was noted.  A similar procedure was  repeated on the patient's left fallopian tube.  Again at the midportion of the fallopian tube, 2 separate 0 plain gut sutures were placed and a 1.5 cm portion of fallopian tube was removed.   Posterior cul-de-sac was irrigated and suctioned and the uterus  was placed back into the abdominal cavity.  The uterine incision appeared hemostatic.  There were additional adhesions to the lateral side of the uterus which were noted, incorporating the bowel with omentum.  These were taken down sharply.  The  pericolic gutters were wiped clean with laparotomy tape and the tubal ligation sites appeared hemostatic.  Interceed was placed over the uterine incision in a T-shaped fashion.  Additional Interceed was placed over the anterior fibroid.  The fascia was  then closed with 0 Vicryl suture in a running nonlocking fashion.  Good approximation of edges.  Fascial edges were then injected with a solution of 20 mL of 1.3% Exparel plus 30 mL of 0.5% Marcaine and 50 mL normal saline; 60 mL of this solution was  injected in the fascial edges.  Subcutaneous tissues were irrigated and bovied for hemostasis and the skin was reapproximated with Insorb absorbable staples and additional 30 mL of  Exparel solution was injected beneath the skin.  The patient did receive  1 g of Tylenol intravenously and 30 mg of Toradol intravenously at the end of the case.  COMPLICATIONS:  There were no complications.  ESTIMATED BLOOD LOSS:  738 mL measured and 1500 mL intraop fluids.  The patient was taken to recovery room in good condition.  VN/NUANCE  D:02/03/2019 T:02/03/2019 JOB:[id number]

## 2019-02-03 NOTE — Brief Op Note (Signed)
02/03/2019  10:20 AM  PATIENT:  Elizabeth Bryan  38 y.o. female  PRE-OPERATIVE DIAGNOSIS:  elective repeat c-section w/ BLT  POST-OPERATIVE DIAGNOSIS:  elective repeat c-section w/ BLT Significant pelvic and abdominal adhesions  PROCEDURE:  Procedure(s): CESAREAN SECTION WITH BILATERAL TUBAL LIGATION (N/A) Lysis of adhesions  SURGEON:  Surgeon(s) and Role:    * Schermerhorn, Gwen Her, MD - Primary  PHYSICIAN ASSISTANT:Rebecca Mcvey , CNm      ASSISTANTS: Pa student jenkins    ANESTHESIA:   spinal  EBL:  738 mL   BLOOD ADMINISTERED:none  DRAINS: Urinary Catheter (Foley)   LOCAL MEDICATIONS USED:  MARCAINE    and BUPIVICAINE   SPECIMEN:  Source of Specimen:  portion right and left tubes  DISPOSITION OF SPECIMEN:  PATHOLOGY  COUNTS:  YES  TOURNIQUET:  * No tourniquets in log *  DICTATION: .Other Dictation: Dictation Number verbal  PLAN OF CARE: Admit to inpatient   PATIENT DISPOSITION:  PACU - hemodynamically stable.   Delay start of Pharmacological VTE agent (>24hrs) due to surgical blood loss or risk of bleeding: not applicable

## 2019-02-03 NOTE — Discharge Summary (Signed)
Obstetrical Discharge Summary  Patient Name: Elizabeth Bryan DOB: 04/29/1980 MRN: [id number]  Date of Admission: 02/03/2019 Date of Delivery: 02/03/2019 Delivered by: Huel Cote MD Date of Discharge: 02/05/2019 Primary OB: [place]  [id number] last menstrual period was 05/06/2018. EDC Estimated Date of Delivery: 02/10/19 Gestational Age at Delivery: [redacted]w[redacted]d   Antepartum complications: see H&P Admitting Diagnosis: 39+0 week elective repeat LTCS and BTL  Secondary Diagnosis: Patient Active Problem List   Diagnosis Date Noted  . Previous cesarean delivery affecting pregnancy 02/03/2019  . Postoperative state 02/03/2019  . Microcytic anemia 12/31/2018  . Abdominal pain in pregnancy, third trimester 11/18/2018  . Incomplete spontaneous abortion 05/14/2015    Augmentation: n/a Complications: see delivery note Intrapartum complications/course: repeat LTCS  Date of Delivery: 02/05/2019  Delivered By: Huel Cote MD Delivery Type: repeat cesarean section, low transverse incision Anesthesia: spinal Placenta: spontaneous Laceration: n/a Episiotomy: none Newborn Data: Female apgars 8/9 wt #8/1.   Postpartum Procedures: none  Post partum course:Patient had an uncomplicated postpartum course.  By time of discharge on POD#2, her pain was controlled on oral pain medications; she had appropriate lochia and was ambulating, voiding without difficulty, tolerating regular diet and passing flatus.   She was deemed stable for discharge to home.    Discharge Physical Exam:  BP 139/74 (BP Location: Right Arm)   Pulse 83   Temp 98.5 F (36.9 C) (Oral)   Resp 20   Ht 5\' 4"  (1.626 m)   Wt 95.7 kg   LMP 05/06/2018   SpO2 100% Comment: Room Air  Breastfeeding Unknown   BMI 36.22 kg/m   General: NAD CV: RRR Pulm: CTABL, nl effort ABD: s/nd/nt, fundus firm and below the umbilicus Lochia: moderate Incision: c/d/i DVT Evaluation: LE non-ttp, no evidence of DVT on  exam.  Hemoglobin  Date Value Ref Range Status  02/05/2019 8.3 (L) 12.0 - 15.0 g/dL Final    Comment:    Reticulocyte Hemoglobin testing may be clinically indicated, consider ordering this additional test [id number]    HGB  Date Value Ref Range Status  01/11/2012 14.0 12.0 - 16.0 g/dL Final   HCT  Date Value Ref Range Status  02/05/2019 27.3 (L) 36.0 - 46.0 % Final  01/11/2012 41.0 35.0 - 47.0 % Final     Disposition: stable, discharge to home. Contraception: BTL at c/s  Prenatal Labs:   ABO, Rh:  O+ Antibody:  neg  Rubella:  Imm/ Vz  IMM RPR:   NR  HBsAg:   neg HIV:   neg GBS:   NEG   Plan:  Elizabeth Bryan was discharged to home in good condition. Follow-up appointment with delivering provider in 2 weeks.  Discharge Medications: Allergies as of 02/05/2019      Reactions   Chocolate Hives      Medication List    STOP taking these medications   aspirin EC 81 MG tablet     TAKE these medications   ibuprofen 600 MG tablet Commonly known as: ADVIL Take 1 tablet (600 mg total) by mouth every 6 (six) hours.   oxyCODONE 5 MG immediate release tablet Commonly known as: Oxy IR/ROXICODONE Take 1 tablet (5 mg total) by mouth every 4 (four) hours as needed for moderate pain.   prenatal multivitamin Tabs tablet Take 1 tablet by mouth daily at 12 noon.   senna-docusate 8.6-50 MG tablet Commonly known as: Senokot-S Take 2 tablets by mouth daily. Start taking on: February 06, 2019  Follow-up Information    Schermerhorn, Gwen Her, MD Follow up in 2 week(s).   Specialty: Obstetrics and Gynecology Contact information: [address] [place] [address] [phone]           Signed: ----- Larey Days, MD, [place] Attending Obstetrician and Gynecologist [facility], Department of [facility]

## 2019-02-03 NOTE — Lactation Note (Signed)
This note was copied from a baby's chart. Lactation Consultation Note  Patient Name: Elizabeth Bryan [id number] Date: 02/03/2019 Reason for consult: Initial assessment  LC in to see mom and baby Elizabeth Bryan once moved to [facility]. L&D nurse assisted with feeds post delivery reporting no problems/concerns. This is second baby for mom who delivered via scheduled c/s. First baby reportedly fed for less than 1 week at breast, and mom states he no longer would latch. Mom has large breast with lots of tissue, baby appears to be best supported in football position, mom propping her breast tissue up. Baby appearing content next to mom at breast, but asleep. LC reviewed breastfeeding basics for newborns. Educated on newborn stomach size, feeding patterns, early hunger cues, benefits of skin to skin, wet/stool diapers, closed versus open hands, and tips/strategies for optimal position and latch.  Mom has been given coconut oil by her RN, LC encouraged use before/after feeds for prevention of soreness/discomfort. LC also demonstrated hand expression, and educated about healing properties on nipples. Elizabeth number is written on whiteboard, mom encouraged to call out with any questions/concerns, and encouraged to rest when possible.  Maternal Data Formula Feeding for Exclusion: No Has patient been taught Hand Expression?: Yes Does the patient have breastfeeding experience prior to this delivery?: Yes  Feeding Feeding Type: Breast Fed  LATCH Score                   Interventions Interventions: Breast feeding basics reviewed  Lactation Tools Discussed/Used     Consult Status Consult Status: Follow-up Date: 02/03/19 Follow-up type: In-patient    Elizabeth Bryan 02/03/2019, 4:37 PM

## 2019-02-03 NOTE — Progress Notes (Signed)
Elective repeat c/s and BTL . Labs reviewed . All questions answered . Proceed

## 2019-02-03 NOTE — Anesthesia Preprocedure Evaluation (Signed)
Anesthesia Evaluation  Patient identified by MRN, date of birth, ID band Patient awake    Reviewed: Allergy & Precautions, H&P , NPO status , Patient's Chart, lab work & pertinent test results, reviewed documented beta blocker date and time   Airway Mallampati: II  TM Distance: >3 FB Neck ROM: full    Dental no notable dental hx. (+) Poor Dentition   Pulmonary neg shortness of breath, asthma , former smoker,    Pulmonary exam normal breath sounds clear to auscultation       Cardiovascular Exercise Tolerance: Good hypertension, On Medications negative cardio ROS Normal cardiovascular exam Rhythm:regular Rate:Normal     Neuro/Psych negative neurological ROS  negative psych ROS   GI/Hepatic negative GI ROS, Neg liver ROS,   Endo/Other  negative endocrine ROSdiabetes, Gestational  Renal/GU negative Renal ROS  negative genitourinary   Musculoskeletal   Abdominal   Peds  Hematology  (+) Blood dyscrasia, anemia ,   Anesthesia Other Findings Past Medical History: No date: Anemia No date: Asthma No date: Gestational diabetes No date: Hypertension     Comment:  with pregnancy No date: Ovarian cyst Past Surgical History: No date: CESAREAN SECTION 05/14/2015: DILATION AND EVACUATION; N/A     Comment:  Procedure: DILATATION AND EVACUATION;  Surgeon: Gae Dry, MD;  Location: [facility] ORS;  Service: Gynecology;                Laterality: N/A; BMI    Body Mass Index: 36.22 kg/m     Reproductive/Obstetrics (+) Pregnancy                             Anesthesia Physical Anesthesia Plan  ASA: II  Anesthesia Plan: General   Post-op Pain Management:    Induction:   PONV Risk Score and Plan:   Airway Management Planned:   Additional Equipment:   Intra-op Plan:   Post-operative Plan:   Informed Consent: I have reviewed the patients History and Physical, chart, labs and  discussed the procedure including the risks, benefits and alternatives for the proposed anesthesia with the patient or authorized representative who has indicated his/her understanding and acceptance.     Dental Advisory Given  Plan Discussed with: CRNA  Anesthesia Plan Comments:         Anesthesia Quick Evaluation

## 2019-02-03 NOTE — Transfer of Care (Signed)
Immediate Anesthesia Transfer of Care Note  Patient: Elizabeth Bryan  Procedure(s) Performed: CESAREAN SECTION WITH BILATERAL TUBAL LIGATION (N/A )  Patient Location: PACU  Anesthesia Type:Spinal  Level of Consciousness: awake, alert  and oriented  Airway & Oxygen Therapy: Patient Spontanous Breathing and Patient connected to nasal cannula oxygen  Post-op Assessment: Report given to RN and Post -op Vital signs reviewed and stable  Post vital signs: Reviewed and stable  Last Vitals:  Vitals Value Taken Time  BP    Temp    Pulse    Resp    SpO2      Last Pain:  Vitals:   02/03/19 0715  TempSrc:   PainSc: 0-No pain         Complications: No apparent anesthesia complications

## 2019-02-04 ENCOUNTER — Encounter: Payer: Self-pay | Admitting: Obstetrics and Gynecology

## 2019-02-04 LAB — COMPREHENSIVE METABOLIC PANEL
ALT: 13 U/L (ref 0–44)
AST: 27 U/L (ref 15–41)
Albumin: 2.3 g/dL — ABNORMAL LOW (ref 3.5–5.0)
Alkaline Phosphatase: 112 U/L (ref 38–126)
Anion gap: 6 (ref 5–15)
BUN: 6 mg/dL (ref 6–20)
CO2: 23 mmol/L (ref 22–32)
Calcium: 8.5 mg/dL — ABNORMAL LOW (ref 8.9–10.3)
Chloride: 107 mmol/L (ref 98–111)
Creatinine, Ser: 0.77 mg/dL (ref 0.44–1.00)
GFR calc Af Amer: >60 mL/min (ref >60)
GFR calc non Af Amer: >60 mL/min (ref >60)
Glucose, Bld: 130 mg/dL — ABNORMAL HIGH (ref 70–99)
Potassium: 3.2 mmol/L — ABNORMAL LOW (ref 3.5–5.1)
Sodium: 136 mmol/L (ref 135–145)
Total Bilirubin: 0.6 mg/dL (ref 0.3–1.2)
Total Protein: 5.1 g/dL — ABNORMAL LOW (ref 6.5–8.1)

## 2019-02-04 LAB — CBC
HCT: 27.7 % — ABNORMAL LOW (ref 36.0–46.0)
Hemoglobin: 8.1 g/dL — ABNORMAL LOW (ref 12.0–15.0)
MCH: 21.9 pg — ABNORMAL LOW (ref 26.0–34.0)
MCHC: 29.2 g/dL — ABNORMAL LOW (ref 30.0–36.0)
MCV: 74.9 fL — ABNORMAL LOW (ref 80.0–100.0)
Platelets: 194 10*3/uL (ref 150–400)
RBC: 3.7 MIL/uL — ABNORMAL LOW (ref 3.87–5.11)
RDW: 28.2 % — ABNORMAL HIGH (ref 11.5–15.5)
WBC: 15.8 10*3/uL — ABNORMAL HIGH (ref 4.0–10.5)
nRBC: 0 % (ref 0.0–0.2)

## 2019-02-04 LAB — SURGICAL PATHOLOGY

## 2019-02-04 MED ORDER — IBUPROFEN 600 MG PO TABS
600.0000 mg | ORAL_TABLET | Freq: Four times a day (QID) | ORAL | Status: DC | PRN
  Administered 2019-02-04 – 2019-02-05 (×4): 600 mg via ORAL
  Filled 2019-02-04 (×4): qty 1

## 2019-02-04 MED ORDER — SODIUM CHLORIDE 0.9 % IV SOLN
200.0000 mg | Freq: Once | INTRAVENOUS | Status: AC
  Administered 2019-02-04: 200 mg via INTRAVENOUS
  Filled 2019-02-04: qty 10

## 2019-02-04 MED ORDER — FERROUS SULFATE 325 (65 FE) MG PO TABS
325.0000 mg | ORAL_TABLET | Freq: Two times a day (BID) | ORAL | Status: DC
  Administered 2019-02-04 – 2019-02-05 (×3): 325 mg via ORAL
  Filled 2019-02-04 (×3): qty 1

## 2019-02-04 MED ORDER — VITAMIN C 500 MG PO TABS
500.0000 mg | ORAL_TABLET | Freq: Two times a day (BID) | ORAL | Status: DC
  Administered 2019-02-04 – 2019-02-05 (×3): 500 mg via ORAL
  Filled 2019-02-04 (×3): qty 1

## 2019-02-04 NOTE — Anesthesia Post-op Follow-up Note (Signed)
  Anesthesia Pain Follow-up Note  Patient: JULIONA BRIGHTMAN  Day #: 1  Date of Follow-up: 02/04/2019 Time: 7:26 AM  Last Vitals:  Vitals:   02/03/19 2029 02/03/19 2328  BP: (!) 143/83 133/78  Pulse: 78 81  Resp: 20 18  Temp: 36.6 C 36.8 C  SpO2: 100% 100%    Level of Consciousness: alert  Pain: none   Side Effects:None  Catheter Site Exam:clean, dry  Anti-Coag Meds (From admission, onward)   Start     Dose/Rate Route Frequency Ordered Stop   02/04/19 0800  enoxaparin (LOVENOX) injection 40 mg     40 mg Subcutaneous Every 24 hours 02/03/19 1446         Plan: D/C from anesthesia care at surgeon's request  Blima Singer

## 2019-02-04 NOTE — Anesthesia Postprocedure Evaluation (Signed)
Anesthesia Post Note  Patient: Elizabeth Bryan  Procedure(s) Performed: CESAREAN SECTION WITH BILATERAL TUBAL LIGATION (N/A )  Patient location during evaluation: Mother Baby Anesthesia Type: General Level of consciousness: awake and alert and oriented     Last Vitals:  Vitals:   02/03/19 2029 02/03/19 2328  BP: (!) 143/83 133/78  Pulse: 78 81  Resp: 20 18  Temp: 36.6 C 36.8 C  SpO2: 100% 100%    Last Pain:  Vitals:   02/03/19 2328  TempSrc: Oral  PainSc:                  Blima Singer

## 2019-02-04 NOTE — Lactation Note (Addendum)
This note was copied from a baby's chart. Lactation Consultation Note  Patient Name: Elizabeth Bryan [id number] Date: 02/04/2019 Reason for consult: Follow-up assessment   Maternal Data  large breasts with flattish, small nipples, can shape at areola to latch baby  Feeding Feeding Type: Breast Fed Baby sleepy, sucks on tongue, swallowing mucous/fluid coming up in throat, therefore not wanting to open mouth, but after attempts able to latch with pressure on jaw to have wider mouth and deeper  latch   LATCH Score Latch: Repeated attempts needed to sustain latch, nipple held in mouth throughout feeding, stimulation needed to elicit sucking reflex.  Audible Swallowing: A few with stimulation  Type of Nipple: Flat, everts with stimulation  Comfort (Breast/Nipple): Soft / non-tender  Hold (Positioning): Assistance needed to correctly position infant at breast and maintain latch.  LATCH Score: 6  Interventions Interventions: Assisted with latch;Skin to skin;Breast massage;Hand express;Breast compression;Adjust position;Support pillows  Lactation Tools Discussed/Used  Encouraged more skin to skin to observe for feeding cues   Consult Status Consult Status: Follow-up Date: 02/05/19 Follow-up type: In-patient    Ferol Luz 02/04/2019, 4:48 PM

## 2019-02-04 NOTE — Progress Notes (Signed)
Pt educated on the use of Chiropodist. RN set up [employer] and set goal for pt to reach 1500 ml. Pt encouraged to do this every couple of hours and all questions were answered.

## 2019-02-04 NOTE — Addendum Note (Signed)
Addendum  created 02/04/19 0726 by Demetrius Charity, CRNA   Clinical Note Signed

## 2019-02-04 NOTE — Lactation Note (Addendum)
This note was copied from a baby's chart. Lactation Consultation Note  Patient Name: Elizabeth Bryan [id number] Date: 02/04/2019 Reason for consult: Follow-up assessment   Maternal Data Formula Feeding for Exclusion: No Does the patient have breastfeeding experience prior to this delivery?: Yes breastfed first son x 2 days, had trouble latching, mom has large breasts and nipples flatten when compressed, but states baby latched well yesterday Feeding Feeding Type: Breast Fed Sleepy, no latch, not opening mouth well, sucking on tongue LATCH Score Latch: Too sleepy or reluctant, no latch achieved, no sucking elicited.                 Interventions Interventions: Assisted with latch;Skin to skin;Breast compression;Adjust position;Support pillows  Lactation Tools Discussed/Used WIC Program: No   Consult Status Consult Status: Follow-up Date: 02/04/19 Follow-up type: In-patient    Ferol Luz 02/04/2019, 1:37 PM

## 2019-02-04 NOTE — Progress Notes (Signed)
Post Op Day 1  Subjective: Doing well, no concerns. Ambulating without difficulty, pain managed with PO meds, tolerating regular diet, and voiding without difficulty.   No fever/chills, chest pain, shortness of breath, nausea/vomiting, or leg pain. No nipple or breast pain.   Objective: BP 129/86 (BP Location: Right Arm)   Pulse 84   Temp 98 F (36.7 C)   Resp 18   Ht 5\' 4"  (1.626 m)   Wt 95.7 kg   LMP 05/06/2018   SpO2 99%   Breastfeeding Unknown   BMI 36.22 kg/m    Physical Exam:  General: alert, cooperative, appears stated age and no distress Breasts: soft/nontender CV: RRR Pulm: nl effort, CTABL Abdomen: soft, non-tender, active bowel sounds Uterine Fundus: firm Incision: healing well, no significant drainage, no dehiscence, no significant erythema Lochia: appropriate DVT Evaluation: No evidence of DVT seen on physical exam. No cords or calf tenderness. No significant calf/ankle edema.  Recent Labs    02/03/19 0646 02/04/19 0539  HGB 9.0* 8.1*  HCT 30.6* 27.7*  WBC 8.0 15.8*  PLT 202 194    Assessment/Plan: 38 y.o. [id number] postop day # 1  -Continue routine postpartum care -Lactation consult PRN for breastfeeding  -BTL performed with cesarean section -Acute blood loss anemia - hemodynamically stable and asymptomatic; start PO ferrous sulfate BID with vitamin C and stool softeners; s/p IV iron infusion this morning -Immunization status: all immunizations up to date  Disposition: Continue inpatient postpartum care    LOS: 1 day   Lisette Grinder, CNM 02/04/2019, 12:56 PM   ----- Lisette Grinder Certified Nurse Midwife [facility]

## 2019-02-05 ENCOUNTER — Ambulatory Visit: Payer: Self-pay

## 2019-02-05 LAB — TYPE AND SCREEN
ABO/RH(D): O POS
Antibody Screen: NEGATIVE
Unit division: 0

## 2019-02-05 LAB — CBC
HCT: 27.3 % — ABNORMAL LOW (ref 36.0–46.0)
Hemoglobin: 8.3 g/dL — ABNORMAL LOW (ref 12.0–15.0)
MCH: 22.3 pg — ABNORMAL LOW (ref 26.0–34.0)
MCHC: 30.4 g/dL (ref 30.0–36.0)
MCV: 73.4 fL — ABNORMAL LOW (ref 80.0–100.0)
Platelets: 230 10*3/uL (ref 150–400)
RBC: 3.72 MIL/uL — ABNORMAL LOW (ref 3.87–5.11)
RDW: 28.5 % — ABNORMAL HIGH (ref 11.5–15.5)
WBC: 15.9 10*3/uL — ABNORMAL HIGH (ref 4.0–10.5)
nRBC: 0 % (ref 0.0–0.2)

## 2019-02-05 LAB — BPAM RBC
Blood Product Expiration Date: 202012022359
Unit Type and Rh: 5100

## 2019-02-05 LAB — PREPARE RBC (CROSSMATCH)

## 2019-02-05 MED ORDER — SENNOSIDES-DOCUSATE SODIUM 8.6-50 MG PO TABS: 2.0000 | ORAL_TABLET | ORAL | 1 refills | Status: AC

## 2019-02-05 MED ORDER — IBUPROFEN 600 MG PO TABS: 600.0000 mg | ORAL_TABLET | Freq: Four times a day (QID) | ORAL | 1 refills | Status: AC

## 2019-02-05 MED ORDER — OXYCODONE HCL 5 MG PO TABS: 5.0000 mg | ORAL_TABLET | ORAL | 0 refills | Status: AC | PRN

## 2019-02-05 NOTE — Discharge Instructions (Signed)
Discharge instructions:  ° °Call office if you have any of the following: headache, visual changes, fever >101.0 F, chills, shortness of breath, breast concerns, excessive vaginal bleeding, incision drainage or problems, leg pain or redness, depression or any other concerns.  ° °Activity: Do not lift > 10 lbs for 6 weeks.  °No intercourse or tampons for 6 weeks.  °No driving for 1-2 weeks.  ° °Call your doctor for increased pain or vaginal bleeding, temperature above 101.0, depression, or concerns.  No strenuous activity or heavy lifting for 6 weeks.  No intercourse, tampons, douching, or enemas for 6 weeks.  No tub baths-showers only.  No driving for 2 weeks or while taking pain medications.  Continue prenatal vitamin and iron.  Increase calories and fluids while breastfeeding. ° °You may have a slight fever when your milk comes in, but it should go away on its own.  If it does not, and rises above 101.0 please call the doctor. ° °For concerns about your baby, please call your pediatrician °For breastfeeding concerns, the lactation consultant can be reached at [phone] ° ° °

## 2019-02-05 NOTE — Lactation Note (Addendum)
This note was copied from a baby's chart. Lactation Consultation Note  Patient Name: Elizabeth Bryan [id number] Date: 02/05/2019   Assisted mom with breast feeding.  Mom is very patient but struggles to latch Elizabeth Bryan deeply and sustain the latch for long interval.  Elizabeth Bryan sucks in top lip which prevents him from getting deep latch.  Elizabeth Bryan has a questionable lip tie.  Demonstrated how to gently flip top lip outward.  Mom only breast fed first baby for 2 weeks reporting having difficulty with her latch as well and she was losing too much weight and had to start supplementing.  Encouraged mom to discuss with Pediatrician and possibly follow up with lactation consultant at [facility].  Telephone numbers given of pediatric dentists in the area in case Elizabeth Bryan needs to have lip repaired.  Mom has large breasts and nipples that are slightly hard and flat.  Mom has [employer] and already has Medela pump through her insurance.  Mom puts Elizabeth Bryan to the breast whenever he demonstrates feeding cues.  Reviewed newborn stomach size, supply and demand, normal course of lactation and routine newborn feeding patterns.  Mom and baby are to be discharged today.  Discussed lactation community resources available along with contact numbers and encouraged to call with any questions, concerns or assistance.  Maternal Data    Feeding    LATCH Score                   Interventions    Lactation Tools Discussed/Used     Consult Status      Elizabeth Bryan 02/05/2019, 10:05 PM

## 2019-02-05 NOTE — Progress Notes (Signed)
Discharge instructions, prescriptions, education, and appointments given and explained. Pt verbalized understanding with no further questions. Pt wheeled to personal vehicle via staff 

## 2020-11-20 ENCOUNTER — Encounter: Payer: Self-pay | Admitting: Hematology and Oncology

## 2020-11-27 ENCOUNTER — Ambulatory Visit: Payer: Managed Care, Other (non HMO) | Admitting: Internal Medicine

## 2020-12-05 ENCOUNTER — Encounter: Payer: Self-pay | Admitting: Hematology and Oncology

## 2020-12-05 ENCOUNTER — Ambulatory Visit: Payer: Self-pay | Admitting: Internal Medicine

## 2021-02-03 IMAGING — US OBSTETRIC <14 WK ULTRASOUND
1 series · 13 of 28 positions shown · non-contrast
Comparison: None.

CLINICAL DATA: Vaginal bleeding

EXAM:
OBSTETRIC <14 WK US AND TRANSVAGINAL OB US
TECHNIQUE: Both transabdominal and transvaginal ultrasound examinations were
performed for complete evaluation of the gestation as well as the
maternal uterus, adnexal regions, and pelvic cul-de-sac.
Transvaginal technique was performed to assess early pregnancy.

[Series 1: obstetric <14 wk ultrasound · 0.17mm/px · 13 of 54 slices shown]
[im 2/54]
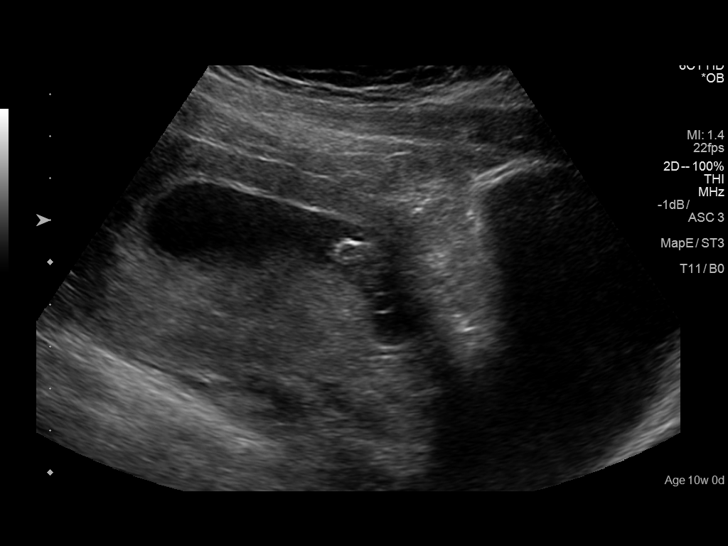
[im 6/54]
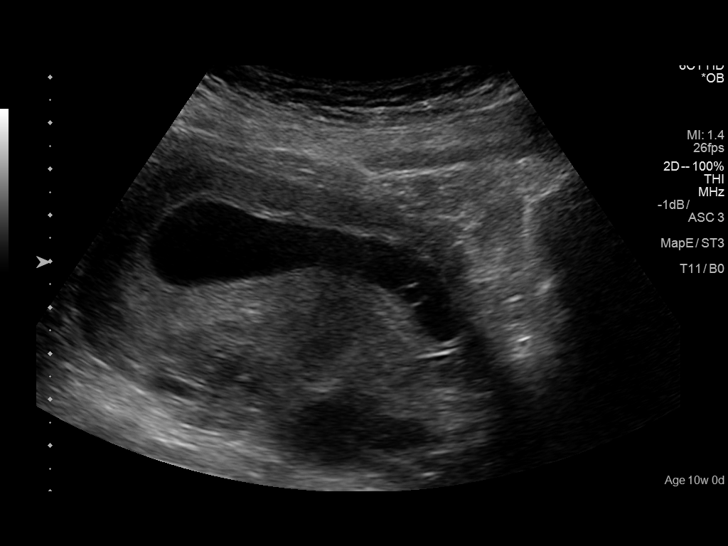
[im 10/54]
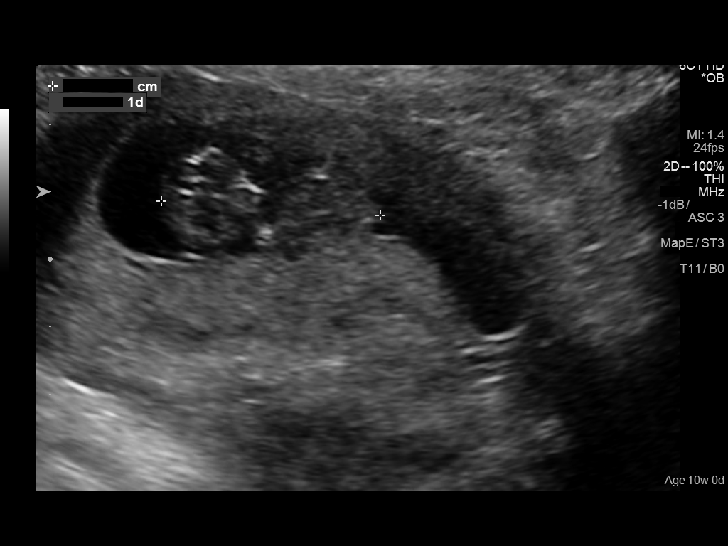
[im 14/54]
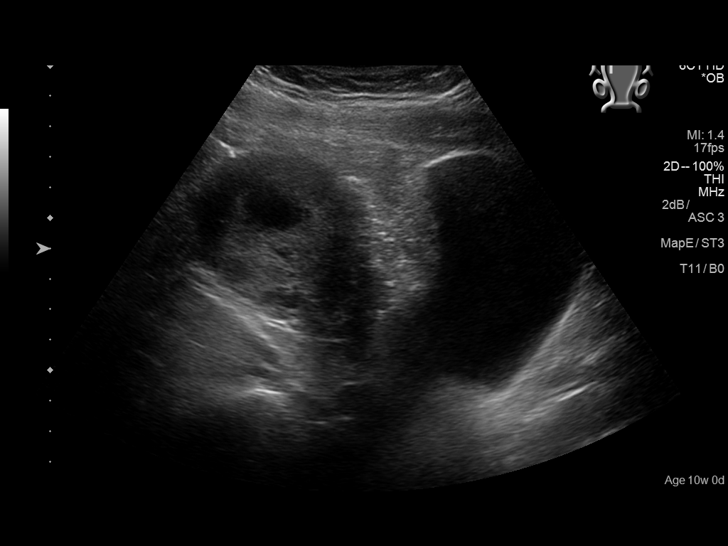
[im 18/54]
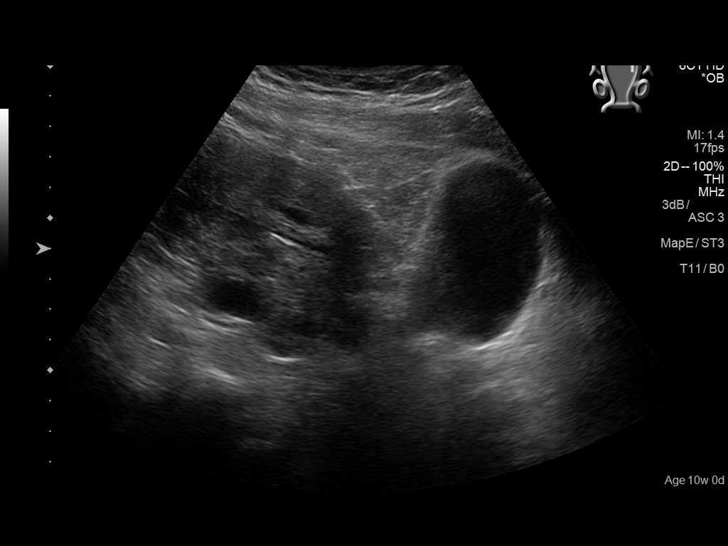
[im 22/54]
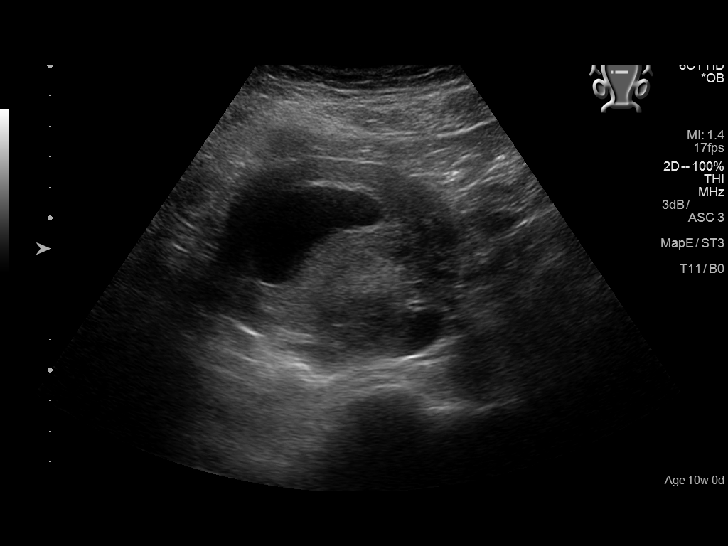
[im 28/54]
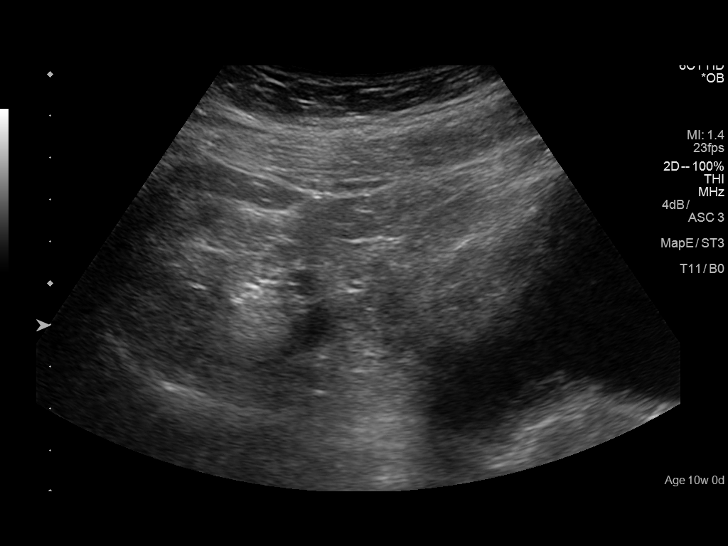
[im 32/54]
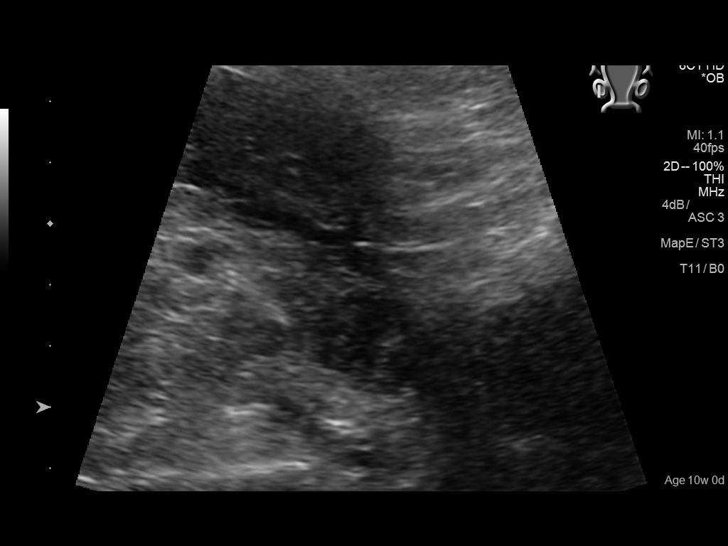
[im 36/54]
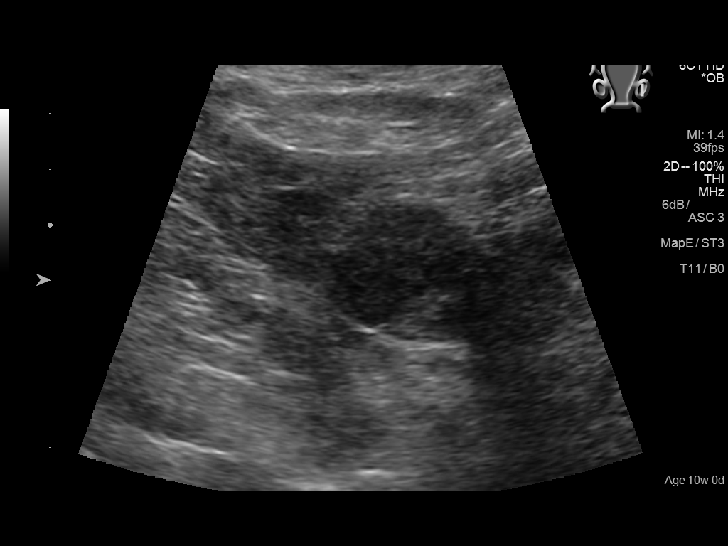
[im 40/54]
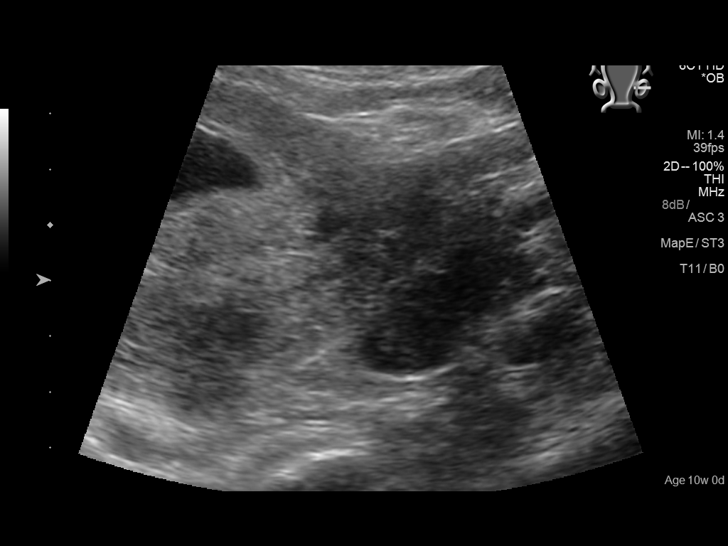
[im 44/54]
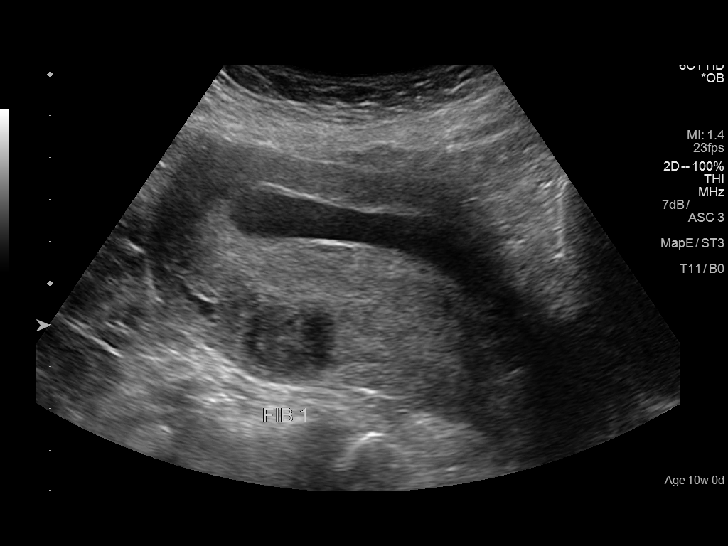
[im 48/54]
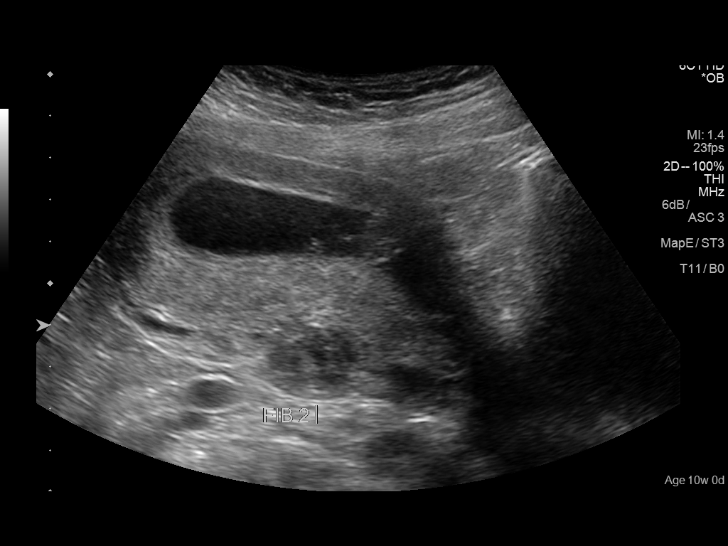
[im 52/54]
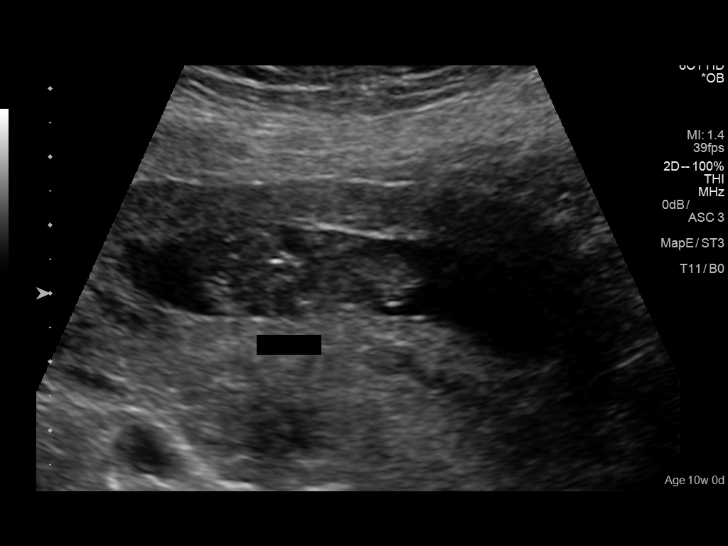

[13 of 28 positions shown; findings below may reference images not displayed]

FINDINGS: Intrauterine gestational sac: Visualized

Yolk sac: Visualized

Embryo:  Visualized

Cardiac Activity: Visualized

Heart Rate: 163 bpm

CRL:  32 mm   10 w   1 d                  US EDC: February 09, 2019

Subchorionic hemorrhage:  None visualized.

Maternal uterus/adnexae: There is a hypoechoic focus in the
posterior uterine fundus measuring 2.2 x 1.8 x 2.9 cm. A second
hypoechoic focus in the uterine fundus measures 2.2 x 1.8 x 2.9 cm
2.1 x 1.8 x 2.1 cm. Cervical os appears closed.

The right ovary measures 2.1 x 1.4 x 1.8 cm. Left ovary measures
x 1.8 x 1.9 cm. There is no extrauterine pelvic or adnexal mass. No
evident free fluid.
IMPRESSION: 1. Single live intrauterine gestation with estimated gestational age
of approximately 10 weeks. No subchorionic hemorrhage evident.

2. Apparent leiomyomas within the mid uterine fundus region
posteriorly.

3.  No extrauterine pelvic mass or free fluid.
